# Patient Record
Sex: Male | Born: 1935 | Race: White | Hispanic: No | Marital: Single | Smoking: Current every day smoker
Health system: Southern US, Community
[De-identification: ages and names within clinical notes are randomized; demographics above are authoritative.]

## PROBLEM LIST (undated history)

## (undated) DIAGNOSIS — J449 Chronic obstructive pulmonary disease, unspecified: Secondary | ICD-10-CM

## (undated) DIAGNOSIS — N289 Disorder of kidney and ureter, unspecified: Secondary | ICD-10-CM

## (undated) DIAGNOSIS — L139 Bullous disorder, unspecified: Secondary | ICD-10-CM

## (undated) DIAGNOSIS — E119 Type 2 diabetes mellitus without complications: Secondary | ICD-10-CM

---

## 1999-01-08 ENCOUNTER — Encounter: Admission: RE | Admit: 1999-01-08 | Discharge: 1999-03-17 | Payer: Self-pay | Admitting: Internal Medicine

## 1999-04-07 ENCOUNTER — Encounter: Admission: RE | Admit: 1999-04-07 | Discharge: 1999-07-06 | Payer: Self-pay | Admitting: Internal Medicine

## 1999-08-04 ENCOUNTER — Encounter: Admission: RE | Admit: 1999-08-04 | Discharge: 1999-11-02 | Payer: Self-pay | Admitting: Orthopedic Surgery

## 2000-03-01 ENCOUNTER — Encounter: Admission: RE | Admit: 2000-03-01 | Discharge: 2000-05-30 | Payer: Self-pay | Admitting: Orthopedic Surgery

## 2000-06-08 ENCOUNTER — Encounter: Admission: RE | Admit: 2000-06-08 | Discharge: 2000-09-06 | Payer: Self-pay | Admitting: Orthopedic Surgery

## 2000-06-24 ENCOUNTER — Encounter: Payer: Self-pay | Admitting: Endocrinology

## 2000-06-24 ENCOUNTER — Ambulatory Visit (HOSPITAL_COMMUNITY): Admission: RE | Admit: 2000-06-24 | Discharge: 2000-06-24 | Payer: Self-pay | Admitting: Endocrinology

## 2000-09-07 ENCOUNTER — Encounter: Admission: RE | Admit: 2000-09-07 | Discharge: 2000-12-06 | Payer: Self-pay | Admitting: Internal Medicine

## 2000-12-15 ENCOUNTER — Encounter: Admission: RE | Admit: 2000-12-15 | Discharge: 2000-12-16 | Payer: Self-pay | Admitting: Internal Medicine

## 2001-04-20 ENCOUNTER — Encounter: Admission: RE | Admit: 2001-04-20 | Discharge: 2001-04-25 | Payer: Self-pay | Admitting: Internal Medicine

## 2002-02-27 ENCOUNTER — Encounter (HOSPITAL_BASED_OUTPATIENT_CLINIC_OR_DEPARTMENT_OTHER): Admission: RE | Admit: 2002-02-27 | Discharge: 2002-03-03 | Payer: Self-pay | Admitting: Orthopedic Surgery

## 2002-06-06 ENCOUNTER — Encounter (HOSPITAL_BASED_OUTPATIENT_CLINIC_OR_DEPARTMENT_OTHER): Admission: RE | Admit: 2002-06-06 | Discharge: 2002-08-30 | Payer: Self-pay | Admitting: Internal Medicine

## 2002-09-15 ENCOUNTER — Encounter (HOSPITAL_BASED_OUTPATIENT_CLINIC_OR_DEPARTMENT_OTHER): Admission: RE | Admit: 2002-09-15 | Discharge: 2002-12-14 | Payer: Self-pay | Admitting: Internal Medicine

## 2003-06-29 ENCOUNTER — Encounter (HOSPITAL_BASED_OUTPATIENT_CLINIC_OR_DEPARTMENT_OTHER): Admission: RE | Admit: 2003-06-29 | Discharge: 2003-09-27 | Payer: Self-pay | Admitting: Internal Medicine

## 2003-09-28 ENCOUNTER — Encounter (HOSPITAL_BASED_OUTPATIENT_CLINIC_OR_DEPARTMENT_OTHER): Admission: RE | Admit: 2003-09-28 | Discharge: 2003-12-14 | Payer: Self-pay | Admitting: Internal Medicine

## 2004-01-02 ENCOUNTER — Encounter (HOSPITAL_BASED_OUTPATIENT_CLINIC_OR_DEPARTMENT_OTHER): Admission: RE | Admit: 2004-01-02 | Discharge: 2004-03-28 | Payer: Self-pay | Admitting: Internal Medicine

## 2004-04-29 ENCOUNTER — Encounter (HOSPITAL_BASED_OUTPATIENT_CLINIC_OR_DEPARTMENT_OTHER): Admission: RE | Admit: 2004-04-29 | Discharge: 2004-07-21 | Payer: Self-pay | Admitting: Internal Medicine

## 2004-10-13 ENCOUNTER — Encounter (HOSPITAL_BASED_OUTPATIENT_CLINIC_OR_DEPARTMENT_OTHER): Admission: RE | Admit: 2004-10-13 | Discharge: 2004-11-14 | Payer: Self-pay | Admitting: Internal Medicine

## 2004-11-28 ENCOUNTER — Encounter (HOSPITAL_BASED_OUTPATIENT_CLINIC_OR_DEPARTMENT_OTHER): Admission: RE | Admit: 2004-11-28 | Discharge: 2005-02-26 | Payer: Self-pay | Admitting: Surgery

## 2005-02-02 ENCOUNTER — Encounter: Admission: RE | Admit: 2005-02-02 | Discharge: 2005-02-02 | Payer: Self-pay | Admitting: Internal Medicine

## 2005-03-04 ENCOUNTER — Encounter: Admission: RE | Admit: 2005-03-04 | Discharge: 2005-03-04 | Payer: Self-pay | Admitting: Internal Medicine

## 2005-05-04 ENCOUNTER — Ambulatory Visit (HOSPITAL_BASED_OUTPATIENT_CLINIC_OR_DEPARTMENT_OTHER): Admission: RE | Admit: 2005-05-04 | Discharge: 2005-05-04 | Payer: Self-pay | Admitting: Urology

## 2005-05-04 ENCOUNTER — Ambulatory Visit (HOSPITAL_COMMUNITY): Admission: RE | Admit: 2005-05-04 | Discharge: 2005-05-04 | Payer: Self-pay | Admitting: Urology

## 2009-01-15 ENCOUNTER — Encounter: Admission: RE | Admit: 2009-01-15 | Discharge: 2009-01-15 | Payer: Self-pay | Admitting: Internal Medicine

## 2010-04-24 ENCOUNTER — Encounter (HOSPITAL_COMMUNITY): Admission: RE | Admit: 2010-04-24 | Discharge: 2010-07-17 | Payer: Self-pay | Admitting: Nephrology

## 2010-04-25 ENCOUNTER — Inpatient Hospital Stay (HOSPITAL_COMMUNITY): Admission: EM | Admit: 2010-04-25 | Discharge: 2010-04-28 | Payer: Self-pay | Admitting: Emergency Medicine

## 2010-10-30 LAB — URINALYSIS, ROUTINE W REFLEX MICROSCOPIC
Glucose, UA: 100 mg/dL — AB
Ketones, ur: NEGATIVE mg/dL
Protein, ur: >300 mg/dL — AB
pH: 5 (ref 5.0–8.0)

## 2010-10-30 LAB — URINE MICROSCOPIC-ADD ON

## 2010-10-30 LAB — POCT I-STAT, CHEM 8
BUN: 44 mg/dL — ABNORMAL HIGH (ref 6–23)
Chloride: 110 mEq/L (ref 96–112)
Creatinine, Ser: 2.3 mg/dL — ABNORMAL HIGH (ref 0.4–1.5)
Glucose, Bld: 215 mg/dL — ABNORMAL HIGH (ref 70–99)
Potassium: 4.2 mEq/L (ref 3.5–5.1)
Sodium: 137 mEq/L (ref 135–145)

## 2010-10-30 LAB — BLOOD GAS, ARTERIAL
Acid-base deficit: 5.7 mmol/L — ABNORMAL HIGH (ref 0.0–2.0)
Drawn by: 252031
O2 Content: 2 L/min
O2 Saturation: 93.9 %
pCO2 arterial: 27.9 mmHg — ABNORMAL LOW (ref 35.0–45.0)

## 2010-10-30 LAB — CBC
HCT: 34.7 % — ABNORMAL LOW (ref 39.0–52.0)
Hemoglobin: 10.2 g/dL — ABNORMAL LOW (ref 13.0–17.0)
Hemoglobin: 10.5 g/dL — ABNORMAL LOW (ref 13.0–17.0)
Hemoglobin: 11.2 g/dL — ABNORMAL LOW (ref 13.0–17.0)
Hemoglobin: 11.6 g/dL — ABNORMAL LOW (ref 13.0–17.0)
MCH: 28.3 pg (ref 26.0–34.0)
MCH: 28.4 pg (ref 26.0–34.0)
MCH: 28.8 pg (ref 26.0–34.0)
MCHC: 32.6 g/dL (ref 30.0–36.0)
MCV: 87.8 fL (ref 78.0–100.0)
Platelets: 157 10*3/uL (ref 150–400)
Platelets: 174 10*3/uL (ref 150–400)
Platelets: 184 10*3/uL (ref 150–400)
RBC: 3.59 MIL/uL — ABNORMAL LOW (ref 4.22–5.81)
RBC: 4.03 MIL/uL — ABNORMAL LOW (ref 4.22–5.81)
RDW: 14.8 % (ref 11.5–15.5)
RDW: 14.9 % (ref 11.5–15.5)
WBC: 10.6 10*3/uL — ABNORMAL HIGH (ref 4.0–10.5)
WBC: 13.9 10*3/uL — ABNORMAL HIGH (ref 4.0–10.5)
WBC: 15 10*3/uL — ABNORMAL HIGH (ref 4.0–10.5)

## 2010-10-30 LAB — LACTIC ACID, PLASMA: Lactic Acid, Venous: 1.8 mmol/L (ref 0.5–2.2)

## 2010-10-30 LAB — URINE CULTURE
Colony Count: >=100000
Culture  Setup Time: 201109091822

## 2010-10-30 LAB — COMPREHENSIVE METABOLIC PANEL
ALT: 20 U/L (ref 0–53)
AST: 21 U/L (ref 0–37)
Albumin: 2.7 g/dL — ABNORMAL LOW (ref 3.5–5.2)
Chloride: 108 mEq/L (ref 96–112)
Creatinine, Ser: 2.2 mg/dL — ABNORMAL HIGH (ref 0.4–1.5)
GFR calc Af Amer: 35 mL/min — ABNORMAL LOW (ref >60)
Potassium: 3.9 mEq/L (ref 3.5–5.1)
Sodium: 136 mEq/L (ref 135–145)
Total Bilirubin: 0.9 mg/dL (ref 0.3–1.2)

## 2010-10-30 LAB — GLUCOSE, CAPILLARY
Glucose-Capillary: 135 mg/dL — ABNORMAL HIGH (ref 70–99)
Glucose-Capillary: 146 mg/dL — ABNORMAL HIGH (ref 70–99)
Glucose-Capillary: 175 mg/dL — ABNORMAL HIGH (ref 70–99)
Glucose-Capillary: 311 mg/dL — ABNORMAL HIGH (ref 70–99)
Glucose-Capillary: 330 mg/dL — ABNORMAL HIGH (ref 70–99)
Glucose-Capillary: 347 mg/dL — ABNORMAL HIGH (ref 70–99)
Glucose-Capillary: 400 mg/dL — ABNORMAL HIGH (ref 70–99)

## 2010-10-30 LAB — MAGNESIUM: Magnesium: 1.9 mg/dL (ref 1.5–2.5)

## 2010-10-30 LAB — BASIC METABOLIC PANEL
BUN: 34 mg/dL — ABNORMAL HIGH (ref 6–23)
CO2: 20 mEq/L (ref 19–32)
CO2: 21 mEq/L (ref 19–32)
Calcium: 8.8 mg/dL (ref 8.4–10.5)
Chloride: 109 mEq/L (ref 96–112)
Creatinine, Ser: 2.12 mg/dL — ABNORMAL HIGH (ref 0.4–1.5)
Creatinine, Ser: 2.41 mg/dL — ABNORMAL HIGH (ref 0.4–1.5)
GFR calc non Af Amer: 30 mL/min — ABNORMAL LOW (ref >60)
Glucose, Bld: 336 mg/dL — ABNORMAL HIGH (ref 70–99)
Sodium: 139 mEq/L (ref 135–145)

## 2010-10-30 LAB — CULTURE, BLOOD (ROUTINE X 2)
Culture: NO GROWTH
Culture: NO GROWTH

## 2010-10-30 LAB — DIFFERENTIAL
Lymphocytes Relative: 8 % — ABNORMAL LOW (ref 12–46)
Lymphs Abs: 1.2 10*3/uL (ref 0.7–4.0)
Monocytes Relative: 9 % (ref 3–12)
Neutrophils Relative %: 83 % — ABNORMAL HIGH (ref 43–77)

## 2010-10-30 LAB — BRAIN NATRIURETIC PEPTIDE: Pro B Natriuretic peptide (BNP): 212 pg/mL — ABNORMAL HIGH (ref 0.0–100.0)

## 2011-01-02 NOTE — Op Note (Signed)
NAME:  Dalton Lane, Dalton Lane NO.:  [id number]   MEDICAL RECORD NO.:  [id number]          PATIENT TYPE:  AMB   LOCATION:  [facility]                         FACILITY:  [facility]   PHYSICIAN:  Maretta Bees. Vonita Moss, M.D.DATE OF BIRTH:  09/11/1929   DATE OF PROCEDURE:  05/04/2005  DATE OF DISCHARGE:                                 OPERATIVE REPORT   PREOPERATIVE DIAGNOSES:  Phimosis and balanitis.   POSTOPERATIVE DIAGNOSES:  Phimosis and balanitis.   PROCEDURE:  Circumcision.   SURGEON:  Dr. Larey Dresser.   ANESTHESIA:  General.   INDICATIONS:  This 77 year old gentleman has had recurrent balanitis and  penile irritation and has had nonretractable foreskin with very thickened  skin. He was counseled about circumcision and brought to the OR for that  reason and wished to be put to sleep with general anesthesia.   DESCRIPTION OF PROCEDURE:  The patient was brought to the operating room,  placed in supine position. The external genitalia were prepped and draped in  the usual fashion. A circumcision was performed using the sleeve technique.  Hemostasis was obtained by catgut ties and electrocautery. The frenulum area  was closed with running 4-0 chromic catgut. The distal edge of penile skin  was reapproximated to the mucosal edge of foreskin a 3, 6, 9 and 12 o'clock  with 4-0 chromic catgut. Running 4-0 chromic catgut was placed between these  quadrant sutures. The wound was cleaned and dressed with Vaseline gauze, dry  sterile gauze and Coban. He was taken to the recovery room in good condition  having tolerated the procedure well.      Maretta Bees. Vonita Moss, M.D.  Electronically Signed     LJP/MEDQ  D:  05/04/2005  T:  05/04/2005  Job:  [id number]

## 2013-05-26 ENCOUNTER — Other Ambulatory Visit (HOSPITAL_COMMUNITY): Payer: Self-pay | Admitting: *Deleted

## 2013-05-29 ENCOUNTER — Ambulatory Visit (HOSPITAL_COMMUNITY)
Admission: RE | Admit: 2013-05-29 | Discharge: 2013-05-29 | Disposition: A | Discharge status: Home / Self Care | Payer: Medicare Other | Source: Ambulatory Visit | Attending: Nephrology | Admitting: Nephrology

## 2013-05-29 DIAGNOSIS — D509 Iron deficiency anemia, unspecified: Secondary | ICD-10-CM | POA: Insufficient documentation

## 2013-05-29 MED ORDER — SODIUM CHLORIDE 0.9 % IV SOLN: INTRAVENOUS | Status: DC

## 2013-05-29 MED ORDER — SODIUM CHLORIDE 0.9 % IV SOLN
1020.0000 mg | Freq: Once | INTRAVENOUS | Status: AC
  Administered 2013-05-29: 1020 mg via INTRAVENOUS
  Filled 2013-05-29: qty 34

## 2014-04-05 ENCOUNTER — Other Ambulatory Visit (HOSPITAL_COMMUNITY): Payer: Self-pay

## 2014-04-06 ENCOUNTER — Ambulatory Visit (HOSPITAL_COMMUNITY)
Admission: RE | Admit: 2014-04-06 | Discharge: 2014-04-06 | Disposition: A | Discharge status: Home / Self Care | Payer: Medicare Other | Source: Ambulatory Visit | Attending: Nephrology | Admitting: Nephrology

## 2014-04-06 ENCOUNTER — Encounter (HOSPITAL_COMMUNITY): Payer: Medicare Other

## 2014-04-06 DIAGNOSIS — D509 Iron deficiency anemia, unspecified: Secondary | ICD-10-CM | POA: Diagnosis present

## 2014-04-06 MED ORDER — FERUMOXYTOL INJECTION 510 MG/17 ML
1020.0000 mg | Freq: Once | INTRAVENOUS | Status: AC
  Administered 2014-04-06: 1020 mg via INTRAVENOUS
  Filled 2014-04-06: qty 34

## 2014-07-24 ENCOUNTER — Inpatient Hospital Stay (HOSPITAL_COMMUNITY)
Admission: EM | Admit: 2014-07-24 | Discharge: 2014-07-30 | DRG: 872 | Disposition: A | Discharge status: Skilled Nursing Facility | Payer: Medicare Other | Attending: Internal Medicine | Admitting: Internal Medicine

## 2014-07-24 ENCOUNTER — Encounter (HOSPITAL_COMMUNITY): Payer: Self-pay | Admitting: Physical Medicine and Rehabilitation

## 2014-07-24 ENCOUNTER — Inpatient Hospital Stay (HOSPITAL_COMMUNITY): Payer: Medicare Other

## 2014-07-24 DIAGNOSIS — L120 Bullous pemphigoid: Secondary | ICD-10-CM | POA: Diagnosis present

## 2014-07-24 DIAGNOSIS — Z7982 Long term (current) use of aspirin: Secondary | ICD-10-CM | POA: Diagnosis not present

## 2014-07-24 DIAGNOSIS — R05 Cough: Secondary | ICD-10-CM

## 2014-07-24 DIAGNOSIS — Z794 Long term (current) use of insulin: Secondary | ICD-10-CM | POA: Diagnosis not present

## 2014-07-24 DIAGNOSIS — IMO0002 Reserved for concepts with insufficient information to code with codable children: Secondary | ICD-10-CM | POA: Diagnosis present

## 2014-07-24 DIAGNOSIS — R6520 Severe sepsis without septic shock: Secondary | ICD-10-CM

## 2014-07-24 DIAGNOSIS — N184 Chronic kidney disease, stage 4 (severe): Secondary | ICD-10-CM | POA: Diagnosis present

## 2014-07-24 DIAGNOSIS — E1165 Type 2 diabetes mellitus with hyperglycemia: Secondary | ICD-10-CM | POA: Diagnosis present

## 2014-07-24 DIAGNOSIS — Z7952 Long term (current) use of systemic steroids: Secondary | ICD-10-CM | POA: Diagnosis not present

## 2014-07-24 DIAGNOSIS — N189 Chronic kidney disease, unspecified: Secondary | ICD-10-CM

## 2014-07-24 DIAGNOSIS — N179 Acute kidney failure, unspecified: Secondary | ICD-10-CM | POA: Diagnosis present

## 2014-07-24 DIAGNOSIS — E1129 Type 2 diabetes mellitus with other diabetic kidney complication: Secondary | ICD-10-CM

## 2014-07-24 DIAGNOSIS — R059 Cough, unspecified: Secondary | ICD-10-CM

## 2014-07-24 DIAGNOSIS — A419 Sepsis, unspecified organism: Secondary | ICD-10-CM | POA: Diagnosis present

## 2014-07-24 DIAGNOSIS — E1122 Type 2 diabetes mellitus with diabetic chronic kidney disease: Secondary | ICD-10-CM

## 2014-07-24 DIAGNOSIS — F17210 Nicotine dependence, cigarettes, uncomplicated: Secondary | ICD-10-CM | POA: Diagnosis present

## 2014-07-24 DIAGNOSIS — L139 Bullous disorder, unspecified: Secondary | ICD-10-CM

## 2014-07-24 DIAGNOSIS — J449 Chronic obstructive pulmonary disease, unspecified: Secondary | ICD-10-CM | POA: Diagnosis present

## 2014-07-24 DIAGNOSIS — R112 Nausea with vomiting, unspecified: Secondary | ICD-10-CM

## 2014-07-24 DIAGNOSIS — L03116 Cellulitis of left lower limb: Secondary | ICD-10-CM | POA: Diagnosis present

## 2014-07-24 DIAGNOSIS — E875 Hyperkalemia: Secondary | ICD-10-CM | POA: Diagnosis present

## 2014-07-24 DIAGNOSIS — L03115 Cellulitis of right lower limb: Secondary | ICD-10-CM | POA: Diagnosis present

## 2014-07-24 DIAGNOSIS — Z881 Allergy status to other antibiotic agents status: Secondary | ICD-10-CM | POA: Diagnosis not present

## 2014-07-24 DIAGNOSIS — R52 Pain, unspecified: Secondary | ICD-10-CM

## 2014-07-24 DIAGNOSIS — I959 Hypotension, unspecified: Secondary | ICD-10-CM

## 2014-07-24 HISTORY — DX: Bullous disorder, unspecified: L13.9

## 2014-07-24 HISTORY — DX: Disorder of kidney and ureter, unspecified: N28.9

## 2014-07-24 HISTORY — DX: Chronic obstructive pulmonary disease, unspecified: J44.9

## 2014-07-24 HISTORY — DX: Type 2 diabetes mellitus without complications: E11.9

## 2014-07-24 LAB — URINALYSIS, ROUTINE W REFLEX MICROSCOPIC
Bilirubin Urine: NEGATIVE
Glucose, UA: 100 mg/dL — AB
Ketones, ur: NEGATIVE mg/dL
NITRITE: NEGATIVE
Protein, ur: NEGATIVE mg/dL
SPECIFIC GRAVITY, URINE: 1.011 (ref 1.005–1.030)
Urobilinogen, UA: 0.2 mg/dL (ref 0.0–1.0)
pH: 7 (ref 5.0–8.0)

## 2014-07-24 LAB — CBC WITH DIFFERENTIAL/PLATELET
Basophils Absolute: 0 10*3/uL (ref 0.0–0.1)
Basophils Relative: 0 % (ref 0–1)
Eosinophils Absolute: 0 10*3/uL (ref 0.0–0.7)
Eosinophils Relative: 0 % (ref 0–5)
HCT: 35.2 % — ABNORMAL LOW (ref 39.0–52.0)
Hemoglobin: 11.4 g/dL — ABNORMAL LOW (ref 13.0–17.0)
LYMPHS ABS: 1.5 10*3/uL (ref 0.7–4.0)
LYMPHS PCT: 14 % (ref 12–46)
MCH: 30.8 pg (ref 26.0–34.0)
MCHC: 32.4 g/dL (ref 30.0–36.0)
MCV: 95.1 fL (ref 78.0–100.0)
Monocytes Absolute: 0.4 10*3/uL (ref 0.1–1.0)
Monocytes Relative: 4 % (ref 3–12)
NEUTROS PCT: 82 % — AB (ref 43–77)
Neutro Abs: 8.5 10*3/uL — ABNORMAL HIGH (ref 1.7–7.7)
PLATELETS: 139 10*3/uL — AB (ref 150–400)
RBC: 3.7 MIL/uL — AB (ref 4.22–5.81)
RDW: 17.2 % — ABNORMAL HIGH (ref 11.5–15.5)
WBC: 10.4 10*3/uL (ref 4.0–10.5)

## 2014-07-24 LAB — COMPREHENSIVE METABOLIC PANEL
ALK PHOS: 70 U/L (ref 39–117)
ALT: 21 U/L (ref 0–53)
AST: 11 U/L (ref 0–37)
Albumin: 3.1 g/dL — ABNORMAL LOW (ref 3.5–5.2)
Anion gap: 21 — ABNORMAL HIGH (ref 5–15)
BUN: 109 mg/dL — ABNORMAL HIGH (ref 6–23)
CO2: 19 meq/L (ref 19–32)
Calcium: 9.2 mg/dL (ref 8.4–10.5)
Chloride: 95 mEq/L — ABNORMAL LOW (ref 96–112)
Creatinine, Ser: 3.19 mg/dL — ABNORMAL HIGH (ref 0.50–1.35)
GFR calc non Af Amer: 17 mL/min — ABNORMAL LOW (ref >90)
GFR, EST AFRICAN AMERICAN: 19 mL/min — AB (ref >90)
GLUCOSE: 295 mg/dL — AB (ref 70–99)
POTASSIUM: 5.5 meq/L — AB (ref 3.7–5.3)
SODIUM: 135 meq/L — AB (ref 137–147)
TOTAL PROTEIN: 6.5 g/dL (ref 6.0–8.3)
Total Bilirubin: 0.4 mg/dL (ref 0.3–1.2)

## 2014-07-24 LAB — URINE MICROSCOPIC-ADD ON

## 2014-07-24 LAB — GLUCOSE, CAPILLARY: GLUCOSE-CAPILLARY: 198 mg/dL — AB (ref 70–99)

## 2014-07-24 LAB — I-STAT CG4 LACTIC ACID, ED: Lactic Acid, Venous: 3.63 mmol/L — ABNORMAL HIGH (ref 0.5–2.2)

## 2014-07-24 LAB — SEDIMENTATION RATE: Sed Rate: 59 mm/hr — ABNORMAL HIGH (ref 0–16)

## 2014-07-24 MED ORDER — SODIUM CHLORIDE 0.9 % IJ SOLN
3.0000 mL | Freq: Two times a day (BID) | INTRAMUSCULAR | Status: DC
  Administered 2014-07-25 – 2014-07-30 (×9): 3 mL via INTRAVENOUS

## 2014-07-24 MED ORDER — ACETAMINOPHEN 325 MG PO TABS: 650.0000 mg | ORAL_TABLET | Freq: Four times a day (QID) | ORAL | Status: DC | PRN

## 2014-07-24 MED ORDER — ASPIRIN EC 325 MG PO TBEC
325.0000 mg | DELAYED_RELEASE_TABLET | Freq: Every day | ORAL | Status: DC
  Administered 2014-07-25 – 2014-07-30 (×6): 325 mg via ORAL
  Filled 2014-07-24 (×7): qty 1

## 2014-07-24 MED ORDER — SODIUM BICARBONATE 650 MG PO TABS
325.0000 mg | ORAL_TABLET | Freq: Every day | ORAL | Status: DC
  Administered 2014-07-25 – 2014-07-30 (×6): 325 mg via ORAL
  Filled 2014-07-24 (×6): qty 0.5

## 2014-07-24 MED ORDER — PREDNISONE 5 MG PO TABS
5.0000 mg | ORAL_TABLET | Freq: Every day | ORAL | Status: DC
  Administered 2014-07-25: 5 mg via ORAL
  Filled 2014-07-24: qty 1

## 2014-07-24 MED ORDER — VANCOMYCIN HCL 10 G IV SOLR
2000.0000 mg | Freq: Once | INTRAVENOUS | Status: AC
  Administered 2014-07-24: 2000 mg via INTRAVENOUS
  Filled 2014-07-24: qty 2000

## 2014-07-24 MED ORDER — HYDROCODONE-ACETAMINOPHEN 7.5-325 MG PO TABS
1.0000 | ORAL_TABLET | Freq: Four times a day (QID) | ORAL | Status: DC | PRN
  Administered 2014-07-25: 1 via ORAL
  Filled 2014-07-24: qty 1

## 2014-07-24 MED ORDER — HEPARIN SODIUM (PORCINE) 5000 UNIT/ML IJ SOLN
5000.0000 [IU] | Freq: Three times a day (TID) | INTRAMUSCULAR | Status: DC
  Administered 2014-07-25 – 2014-07-30 (×17): 5000 [IU] via SUBCUTANEOUS
  Filled 2014-07-24 (×22): qty 1

## 2014-07-24 MED ORDER — ROFLUMILAST 500 MCG PO TABS
500.0000 ug | ORAL_TABLET | Freq: Every day | ORAL | Status: DC
  Administered 2014-07-25 – 2014-07-30 (×6): 500 ug via ORAL
  Filled 2014-07-24 (×6): qty 1

## 2014-07-24 MED ORDER — INSULIN ASPART 100 UNIT/ML ~~LOC~~ SOLN
0.0000 [IU] | Freq: Three times a day (TID) | SUBCUTANEOUS | Status: DC
  Administered 2014-07-25 – 2014-07-26 (×3): 3 [IU] via SUBCUTANEOUS
  Administered 2014-07-26: 11 [IU] via SUBCUTANEOUS
  Administered 2014-07-27: 2 [IU] via SUBCUTANEOUS
  Administered 2014-07-27: 3 [IU] via SUBCUTANEOUS
  Administered 2014-07-27: 5 [IU] via SUBCUTANEOUS
  Administered 2014-07-28: 2 [IU] via SUBCUTANEOUS
  Administered 2014-07-28: 5 [IU] via SUBCUTANEOUS
  Administered 2014-07-28: 2 [IU] via SUBCUTANEOUS
  Administered 2014-07-29: 3 [IU] via SUBCUTANEOUS

## 2014-07-24 MED ORDER — SODIUM CHLORIDE 0.9 % IV BOLUS (SEPSIS)
1000.0000 mL | Freq: Once | INTRAVENOUS | Status: AC
  Administered 2014-07-24: 1000 mL via INTRAVENOUS

## 2014-07-24 MED ORDER — PRAVASTATIN SODIUM 80 MG PO TABS
80.0000 mg | ORAL_TABLET | Freq: Every day | ORAL | Status: DC
  Administered 2014-07-25 – 2014-07-30 (×6): 80 mg via ORAL
  Filled 2014-07-24 (×7): qty 1

## 2014-07-24 MED ORDER — CEFTRIAXONE SODIUM 1 G IJ SOLR
1.0000 g | Freq: Once | INTRAMUSCULAR | Status: AC
  Administered 2014-07-24: 1 g via INTRAVENOUS
  Filled 2014-07-24: qty 10

## 2014-07-24 MED ORDER — SODIUM CHLORIDE 0.9 % IV BOLUS (SEPSIS): 500.0000 mL | Freq: Once | INTRAVENOUS | Status: DC

## 2014-07-24 MED ORDER — SODIUM CHLORIDE 0.9 % IV SOLN: INTRAVENOUS | Status: DC

## 2014-07-24 MED ORDER — METOPROLOL SUCCINATE ER 50 MG PO TB24
50.0000 mg | ORAL_TABLET | Freq: Every day | ORAL | Status: DC
  Administered 2014-07-25: 50 mg via ORAL
  Filled 2014-07-24 (×2): qty 1

## 2014-07-24 MED ORDER — INSULIN GLARGINE 100 UNIT/ML ~~LOC~~ SOLN
24.0000 [IU] | Freq: Every day | SUBCUTANEOUS | Status: DC
  Administered 2014-07-24 – 2014-07-29 (×6): 24 [IU] via SUBCUTANEOUS
  Filled 2014-07-24 (×8): qty 0.24

## 2014-07-24 MED ORDER — SODIUM CHLORIDE 0.9 % IV SOLN
INTRAVENOUS | Status: DC
  Administered 2014-07-25 – 2014-07-26 (×2): via INTRAVENOUS

## 2014-07-24 MED ORDER — DEXTROSE 5 % IV SOLN
1.0000 g | INTRAVENOUS | Status: DC
  Administered 2014-07-25 – 2014-07-29 (×5): 1 g via INTRAVENOUS
  Filled 2014-07-24 (×6): qty 1

## 2014-07-24 MED ORDER — DEXTROSE 5 % IV SOLN
2.0000 g | Freq: Once | INTRAVENOUS | Status: AC
  Administered 2014-07-25: 2 g via INTRAVENOUS
  Filled 2014-07-24: qty 2

## 2014-07-24 NOTE — H&P (Addendum)
[facility] History and Physical  Dalton Lane ZOX:[id number] DOB: 06-02-1930 DOA: 07/24/2014  Referring physician: EDP PCP: Alva GarnetSHELTON,KIMBERLY R., MD   Chief Complaint: Sepsis   HPI: Dalton Lane is a 80 y.o. male with history of bullous skin disease outbreaks (unclear if bullous pemphigoid or pemphigus vulgaris).  [facility] with outbreak on BLE lower legs and feet which have become erythematous, edematous.  Symptoms worsening and onset over the past week, LLE has gotten really bad his wife says especially over the past day.  He has a h/o DM2 and does have chronic numbness of BLE but states he has feeling in B feet and isnt really in pain down there.  Review of Systems: Systems reviewed.  As above, otherwise negative  Past Medical History  Diagnosis Date  . COPD (chronic obstructive pulmonary disease)   . Kidney disease   . Diabetes mellitus without complication   . Skin disease, bullous    History reviewed. No pertinent past surgical history. Social History:  reports that he has never smoked. He does not have any smokeless tobacco history on file. He reports that he does not drink alcohol or use illicit drugs.  Allergies  Allergen Reactions  . Amoxicillin Itching    No family history on file.   Prior to Admission medications   Medication Sig Start Date End Date Taking? Authorizing Provider  acetaminophen (TYLENOL) 325 MG tablet Take 650 mg by mouth every 6 (six) hours as needed for mild pain.   Yes Historical Provider, MD  aspirin EC 325 MG tablet Take 325 mg by mouth daily.   Yes Historical Provider, MD  Cholecalciferol (VITAMIN D3) 2000 UNITS TABS Take 2,000 Units by mouth daily.   Yes Historical Provider, MD  furosemide (LASIX) 40 MG tablet Take 40 mg by mouth daily.   Yes Historical Provider, MD  HYDROcodone-acetaminophen (NORCO) 7.5-325 MG per tablet Take 1 tablet by mouth every 6 (six) hours as needed for moderate pain.   Yes  Historical Provider, MD  insulin aspart (NOVOLOG) 100 UNIT/ML injection Inject 6-7 Units into the skin daily as needed (with supper according to sliding scale).   Yes Historical Provider, MD  insulin glargine (LANTUS) 100 UNIT/ML injection Inject 24 Units into the skin at bedtime.   Yes Historical Provider, MD  linagliptin (TRADJENTA) 5 MG TABS tablet Take 5 mg by mouth daily.   Yes Historical Provider, MD  metoprolol succinate (TOPROL-XL) 50 MG 24 hr tablet Take 50 mg by mouth daily. Take with or immediately following a meal.   Yes Historical Provider, MD  pravastatin (PRAVACHOL) 80 MG tablet Take 80 mg by mouth daily.   Yes Historical Provider, MD  predniSONE (DELTASONE) 5 MG tablet Take 5 mg by mouth daily.   Yes Historical Provider, MD  roflumilast (DALIRESP) 500 MCG TABS tablet Take 500 mcg by mouth daily.   Yes Historical Provider, MD  sodium bicarbonate 325 MG tablet Take 325 mg by mouth daily.   Yes Historical Provider, MD  triamcinolone cream (KENALOG) 0.1 % Apply 1 application topically 2 (two) times daily as needed (for itching).   Yes Historical Provider, MD   Physical Exam: Filed Vitals:   07/24/14 1929  BP: 96/58  Pulse: 80  Temp: 97.7 F (36.5 C)  Resp: 29    BP 96/58 mmHg  Pulse 80  Temp(Src) 97.7 F (36.5 C) (Oral)  Resp 29  SpO2 95%  General Appearance:    Alert, oriented, no distress,  appears stated age  Head:    Normocephalic, atraumatic  Eyes:    PERRL, EOMI, sclera non-icteric        Nose:   Nares without drainage or epistaxis. Mucosa, turbinates normal  Throat:   Moist mucous membranes. Oropharynx without erythema or exudate.  Neck:   Supple. No carotid bruits.  No thyromegaly.  No lymphadenopathy.   Back:     No CVA tenderness, no spinal tenderness  Lungs:     Clear to auscultation bilaterally, without wheezes, rhonchi or rales  Chest wall:    No tenderness to palpitation  Heart:    Regular rate and rhythm without murmurs, gallops, rubs  Abdomen:      Soft, non-tender, nondistended, normal bowel sounds, no organomegaly  Genitalia:    deferred  Rectal:    deferred  Extremities:   No clubbing, cyanosis or edema.  Pulses:   2+ and symmetric all extremities  Skin:   BLE edema, erythema, skin breakdown, blood blister on bottom of L foot, 4cm blister to dorsum of lateral distal aspect of left foot near small toe with purple discoloration.  No pain beyond site of erythema, no crepitus.  Lymph nodes:   Cervical, supraclavicular, and axillary nodes normal  Neurologic:   CNII-XII intact. Normal strength, sensation and reflexes      throughout    Labs on Admission:  Basic Metabolic Panel:  Recent Labs Lab 07/24/14 1826  NA 135*  K 5.5*  CL 95*  CO2 19  GLUCOSE 295*  BUN 109*  CREATININE 3.19*  CALCIUM 9.2   Liver Function Tests:  Recent Labs Lab 07/24/14 1826  AST 11  ALT 21  ALKPHOS 70  BILITOT 0.4  PROT 6.5  ALBUMIN 3.1*   No results for input(s): LIPASE, AMYLASE in the last 168 hours. No results for input(s): AMMONIA in the last 168 hours. CBC:  Recent Labs Lab 07/24/14 1826  WBC 10.4  NEUTROABS 8.5*  HGB 11.4*  HCT 35.2*  MCV 95.1  PLT 139*   Cardiac Enzymes: No results for input(s): CKTOTAL, CKMB, CKMBINDEX, TROPONINI in the last 168 hours.  BNP (last 3 results) No results for input(s): PROBNP in the last 8760 hours. CBG: No results for input(s): GLUCAP in the last 168 hours.  Radiological Exams on Admission: No results found.  EKG: Independently reviewed.  Assessment/Plan Principal Problem:   Severe sepsis with acute organ dysfunction Active Problems:   Acute on chronic kidney failure   Cellulitis of left foot   Cellulitis of right foot   Nausea and vomiting   DM2 (diabetes mellitus, type 2)   Hyperkalemia   1. Severe sepsis with kidney failure - due to cellulitis of BLE 1. Cefepime and vanc per pharm 2. IVF resuscitation 3. Has systemic symptoms and a discolored blood blister on bottom of  LLE that needs watching; however, patient is not raising any other red flags for necrotizing faciitis at this time (no pain beyond site of infection, no subcutaneous emphyzema, etc).  No indication for emergent surgical consultation at this time, likely will need routine surgical consultation re-debridement in AM. 4. Recheck lactate 2. AKI on CKD - see above 1. Strict intake and output 2. Hold lasix 3. IVF resuscitation 4. Place foley 5. Repeat BMP in AM, keep an eye on the mild hyperkalemia he has as well. 6. Continue home PO bicarb 3. DM2 1. Continue home lantus 2. Add med dose SSI AC/HS 4. Bullous skin disease 1. Continue prednisone 5. Nausea and  vomiting - symptom controll   Code Status: Full Code  Family Communication: Wife at bedside Disposition Plan: Admit to inpatient   Time spent: 70 min  Shaunak Kreis M. [facility] Pager [phone]  If 7AM-7PM, please contact the day team taking care of the patient [email or web address] Password [facility] 07/24/2014, 9:32 PM

## 2014-07-24 NOTE — ED Notes (Signed)
Admitting Dr at Bedside at bedside.

## 2014-07-24 NOTE — Progress Notes (Signed)
ANTIBIOTIC CONSULT NOTE - INITIAL  Pharmacy Consult for vancomycin, cefepime Indication: cellulitis  Allergies  Allergen Reactions  . Amoxicillin Itching    Patient Measurements:   Adjusted Body Weight:   Vital Signs: Temp: 97.7 F (36.5 C) (12/08 1929) Temp Source: Oral (12/08 1929) BP: 100/58 mmHg (12/08 2100) Pulse Rate: 70 (12/08 2100) Intake/Output from previous day:   Intake/Output from this shift:    Labs:  Recent Labs  07/24/14 1826  WBC 10.4  HGB 11.4*  PLT 139*  CREATININE 3.19*   CrCl cannot be calculated (Unknown ideal weight.). No results for input(s): VANCOTROUGH, VANCOPEAK, VANCORANDOM, GENTTROUGH, GENTPEAK, GENTRANDOM, TOBRATROUGH, TOBRAPEAK, TOBRARND, AMIKACINPEAK, AMIKACINTROU, AMIKACIN in the last 72 hours.   Microbiology: No results found for this or any previous visit (from the past 720 hour(s)).  Medical History: Past Medical History  Diagnosis Date  . COPD (chronic obstructive pulmonary disease)   . Kidney disease   . Diabetes mellitus without complication   . Skin disease, bullous     Medications:  See EMR  Assessment: Cellulitis of BLE, has acute on chronic kidney injury, SCr up to 3.19, wbc wnl, LA 3.6, rec'd ceftriaxone x1 in ED - now with plan to broaden coverage. Was taking doxycycline at home with no improvement.  Goal of Therapy:  Vancomycin trough level 10-15 mcg/ml  Plan:  Vancomycin 2 g IV x1 Cefepime 2 g IV x1, then 1 g IV q24h Watch renal function, cultures, duration of therapy    Agapito GamesAlison Levette Paulick, PharmD, BCPS Clinical Pharmacist Pager: [phone] 07/24/2014 9:47 PM

## 2014-07-24 NOTE — ED Notes (Signed)
Report attempt x 1 

## 2014-07-24 NOTE — ED Notes (Signed)
I stat lactic acid results:  3.63 mmol/L

## 2014-07-24 NOTE — ED Notes (Signed)
Patient transported to CT 

## 2014-07-24 NOTE — ED Provider Notes (Signed)
CSN: [id number]     Arrival date & time 07/24/14  1810 History   First MD Initiated Contact with Patient 07/24/14 1925     Chief Complaint  Patient presents with  . Foot Pain  . Leg Swelling     (Consider location/radiation/quality/duration/timing/severity/associated sxs/prior Treatment) HPI Comments: 80 year old male with history of COPD, diabetes, bullous pemphigoid presents with lower extremity edema, vomiting, worsening blistering and rash to the lower extremities for the past week. Patient is been on doxycycline however symptoms worsening and now not tolerating oral. Patient sent over to be admitted.  Patient has diabetes history and has chronic numbness in the lower extremities.  Patient is a 80 y.o. male presenting with lower extremity pain. The history is provided by the patient.  Foot Pain Pertinent negatives include no chest pain, no abdominal pain, no headaches and no shortness of breath.    Past Medical History  Diagnosis Date  . COPD (chronic obstructive pulmonary disease)   . Kidney disease   . Diabetes mellitus without complication   . Skin disease, bullous    History reviewed. No pertinent past surgical history. History reviewed. No pertinent family history. History  Substance Use Topics  . Smoking status: Current Every Day Smoker -- 0.50 packs/day for 59 years    Types: Cigarettes  . Smokeless tobacco: Not on file  . Alcohol Use: No    Review of Systems  Constitutional: Positive for fever and fatigue. Negative for chills.  HENT: Negative for congestion.   Eyes: Negative for visual disturbance.  Respiratory: Positive for cough. Negative for shortness of breath.   Cardiovascular: Positive for leg swelling. Negative for chest pain.  Gastrointestinal: Positive for nausea and vomiting. Negative for abdominal pain.  Genitourinary: Negative for dysuria and flank pain.  Musculoskeletal: Negative for back pain, neck pain and neck stiffness.  Skin: Positive for  rash.  Neurological: Negative for light-headedness and headaches.      Allergies  Amoxicillin  Home Medications   Prior to Admission medications   Medication Sig Start Date End Date Taking? Authorizing Provider  acetaminophen (TYLENOL) 325 MG tablet Take 650 mg by mouth every 6 (six) hours as needed for mild pain.   Yes Historical Provider, MD  aspirin EC 325 MG tablet Take 325 mg by mouth daily.   Yes Historical Provider, MD  Cholecalciferol (VITAMIN D3) 2000 UNITS TABS Take 2,000 Units by mouth daily.   Yes Historical Provider, MD  furosemide (LASIX) 40 MG tablet Take 40 mg by mouth daily.   Yes Historical Provider, MD  HYDROcodone-acetaminophen (NORCO) 7.5-325 MG per tablet Take 1 tablet by mouth every 6 (six) hours as needed for moderate pain.   Yes Historical Provider, MD  insulin aspart (NOVOLOG) 100 UNIT/ML injection Inject 6-7 Units into the skin daily as needed (with supper according to sliding scale).   Yes Historical Provider, MD  insulin glargine (LANTUS) 100 UNIT/ML injection Inject 24 Units into the skin at bedtime.   Yes Historical Provider, MD  linagliptin (TRADJENTA) 5 MG TABS tablet Take 5 mg by mouth daily.   Yes Historical Provider, MD  metoprolol succinate (TOPROL-XL) 50 MG 24 hr tablet Take 50 mg by mouth daily. Take with or immediately following a meal.   Yes Historical Provider, MD  pravastatin (PRAVACHOL) 80 MG tablet Take 80 mg by mouth daily.   Yes Historical Provider, MD  predniSONE (DELTASONE) 5 MG tablet Take 5 mg by mouth daily.   Yes Historical Provider, MD  roflumilast (DALIRESP) 500 MCG  TABS tablet Take 500 mcg by mouth daily.   Yes Historical Provider, MD  sodium bicarbonate 325 MG tablet Take 325 mg by mouth daily.   Yes Historical Provider, MD  triamcinolone cream (KENALOG) 0.1 % Apply 1 application topically 2 (two) times daily as needed (for itching).   Yes Historical Provider, MD   BP 113/62 mmHg  Pulse 84  Temp(Src) 98.1 F (36.7 C) (Oral)   Resp 21  Ht 6\' 2"  (1.88 m)  Wt 229 lb 11.5 oz (104.2 kg)  BMI 29.48 kg/m2  SpO2 100% Physical Exam  Constitutional: He is oriented to person, place, and time. He appears well-developed and well-nourished.  HENT:  Head: Normocephalic and atraumatic.  Eyes: Conjunctivae are normal. Right eye exhibits no discharge. Left eye exhibits no discharge.  Neck: Normal range of motion. Neck supple. No tracheal deviation present.  Cardiovascular: Normal rate and regular rhythm.   Pulmonary/Chest: Effort normal and breath sounds normal. Respiratory distress: decreased effort.  Abdominal: Soft. He exhibits no distension. There is no tenderness. There is no guarding.  Musculoskeletal: He exhibits edema ( Bilateral lower extremities 2+).  Neurological: He is alert and oriented to person, place, and time. GCS eye subscore is 4. GCS verbal subscore is 5. GCS motor subscore is 6.  Patient has mild general weakness, patient is able to pull himself to seated without assistance. No meningismus. Mild sedate however easily awaken to verbal.  Skin: Skin is warm. No rash noted.  Patient has multiple excoriations, partially open blisters and some full blisters to bilateral lower extremities. Most significantly patient has approximately 4 cm bullae to the dorsum lateral distal aspect of the left foot near the small toe with purple discoloration. Patient has equal strength bilateral lower extremity with mild general weakness. Mild erythema and warmth bilateral dorsum of the feet.  Psychiatric: He has a normal mood and affect.  Nursing note and vitals reviewed.   ED Course  Procedures (including critical care time) Labs Review Labs Reviewed  CBC WITH DIFFERENTIAL - Abnormal; Notable for the following:    RBC 3.70 (*)    Hemoglobin 11.4 (*)    HCT 35.2 (*)    RDW 17.2 (*)    Platelets 139 (*)    Neutrophils Relative % 82 (*)    Neutro Abs 8.5 (*)    All other components within normal limits  COMPREHENSIVE  METABOLIC PANEL - Abnormal; Notable for the following:    Sodium 135 (*)    Potassium 5.5 (*)    Chloride 95 (*)    Glucose, Bld 295 (*)    BUN 109 (*)    Creatinine, Ser 3.19 (*)    Albumin 3.1 (*)    GFR calc non Af Amer 17 (*)    GFR calc Af Amer 19 (*)    Anion gap 21 (*)    All other components within normal limits  SEDIMENTATION RATE - Abnormal; Notable for the following:    Sed Rate 59 (*)    All other components within normal limits  URINALYSIS, ROUTINE W REFLEX MICROSCOPIC - Abnormal; Notable for the following:    APPearance CLOUDY (*)    Glucose, UA 100 (*)    Hgb urine dipstick TRACE (*)    Leukocytes, UA SMALL (*)    All other components within normal limits  URINE MICROSCOPIC-ADD ON - Abnormal; Notable for the following:    Casts HYALINE CASTS (*)    All other components within normal limits  GLUCOSE, CAPILLARY -  Abnormal; Notable for the following:    Glucose-Capillary 198 (*)    All other components within normal limits  I-STAT CG4 LACTIC ACID, ED - Abnormal; Notable for the following:    Lactic Acid, Venous 3.63 (*)    All other components within normal limits  URINE CULTURE  CULTURE, BLOOD (ROUTINE X 2)  CULTURE, BLOOD (ROUTINE X 2)  MRSA PCR SCREENING  C-REACTIVE PROTEIN  CBC  BASIC METABOLIC PANEL  LACTIC ACID, PLASMA    Imaging Review Dg Chest 2 View  07/24/2014   CLINICAL DATA:  COPD, diabetes  EXAM: CHEST  2 VIEW  COMPARISON:  04/26/2010  FINDINGS: Persistent lingular scarring. Left lower lobe opacity which is increased compared with CT chest dated 04/25/2010. There is no pleural effusion or pneumothorax. Stable cardiomediastinal silhouette. Unremarkable osseous structures.  IMPRESSION: Left lower lobe airspace opacity which is increased compared with prior exams which may reflect scarring with concomitant atelectasis or pneumonia.   Electronically Signed   By: Elige Ko   On: 07/24/2014 22:13   Dg Foot 2 Views Left  07/24/2014   CLINICAL DATA:   Swelling and redness in bilateral feet. Open sores of the posterior distal tibia.  EXAM: LEFT FOOT - 2 VIEW  COMPARISON:  None.  FINDINGS: There is no evidence of fracture or dislocation. There is no evidence of arthropathy or other focal bone abnormality. There is soft tissue swelling over the dorsal midfoot. There is peripheral vascular atherosclerotic disease.  IMPRESSION: 1. No radiographic evidence of osteomyelitis of the left foot. Soft tissue swelling over the dorsal aspect of the left midfoot and plantar aspect of the left forefoot which may reflect cellulitis.   Electronically Signed   By: Elige Ko   On: 07/24/2014 22:09   Dg Foot 2 Views Right  07/24/2014   CLINICAL DATA:  Swelling and redness of bilateral feet  EXAM: RIGHT FOOT - 2 VIEW  COMPARISON:  None.  FINDINGS: There is no evidence of fracture or dislocation. There is no evidence of arthropathy or other focal bone abnormality. There is peripheral vascular atherosclerotic disease. Soft tissue swelling over the dorsal midfoot.  IMPRESSION: No radiographic evidence of osteomyelitis of the right foot. Soft tissue swelling over the dorsal midfoot.   Electronically Signed   By: Elige Ko   On: 07/24/2014 22:10     EKG Interpretation None      MDM   Final diagnoses:  Cough  Pain  Transient hypotension  Severe sepsis with acute organ dysfunction  Acute on chronic kidney failure  Cellulitis of left foot  Cellulitis of right foot  Non-intractable vomiting with nausea, vomiting of unspecified type   Concern for infection either urine versus skin with presentation. Lactate elevated 3.6, blood pressure in the room 80s systolic. IV fluid bolus given, IV antibiotics given, blood work reviewed. Hyperglycemia, patient currently on prednisone and methotrexate for skin disease. Patient high risk due to these medicines with likely immunosuppression. Vanco and Rocephin given, cefepime ordered for broader coverage. Repeat hospitalist for  admission.  The patients results and plan were reviewed and discussed.   Any x-rays performed were personally reviewed by myself.   Differential diagnosis were considered with the presenting HPI.  Medications  ceFEPIme (MAXIPIME) 2 g in dextrose 5 % 50 mL IVPB (2 g Intravenous Given 07/25/14 0039)  acetaminophen (TYLENOL) tablet 650 mg (not administered)  aspirin EC tablet 325 mg (325 mg Oral Not Given 07/25/14 0000)  HYDROcodone-acetaminophen (NORCO) 7.5-325 MG per tablet 1  tablet (not administered)  metoprolol succinate (TOPROL-XL) 24 hr tablet 50 mg (50 mg Oral Given 07/25/14 0039)  insulin glargine (LANTUS) injection 24 Units (24 Units Subcutaneous Given 07/24/14 2348)  sodium bicarbonate tablet 325 mg (not administered)  predniSONE (DELTASONE) tablet 5 mg (not administered)  pravastatin (PRAVACHOL) tablet 80 mg (not administered)  roflumilast (DALIRESP) tablet 500 mcg (not administered)  0.9 %  sodium chloride infusion ( Intravenous Rate/Dose Change 07/24/14 2230)  heparin injection 5,000 Units (5,000 Units Subcutaneous Given 07/25/14 0039)  sodium chloride 0.9 % injection 3 mL (3 mLs Intravenous Not Given 07/24/14 2200)  insulin aspart (novoLOG) injection 0-15 Units (not administered)  ceFEPIme (MAXIPIME) 1 g in dextrose 5 % 50 mL IVPB (not administered)  vancomycin (VANCOCIN) 2,000 mg in sodium chloride 0.9 % 500 mL IVPB (2,000 mg Intravenous New Bag/Given 07/24/14 2059)  sodium chloride 0.9 % bolus 1,000 mL (0 mLs Intravenous Stopped 07/24/14 2204)  cefTRIAXone (ROCEPHIN) 1 g in dextrose 5 % 50 mL IVPB (0 g Intravenous Stopped 07/24/14 2058)    Filed Vitals:   07/24/14 2206 07/24/14 2230 07/24/14 2255 07/25/14 0039  BP: 114/70 132/69 107/64 113/62  Pulse:   77 84  Temp:  98.1 F (36.7 C)    TempSrc:  Oral    Resp:  19 21   Height:   6\' 2"  (1.88 m)   Weight:   229 lb 11.5 oz (104.2 kg)   SpO2:   100%     Final diagnoses:  Cough  Pain  Transient hypotension  Severe sepsis  with acute organ dysfunction  Acute on chronic kidney failure  Cellulitis of left foot  Cellulitis of right foot  Non-intractable vomiting with nausea, vomiting of unspecified type    Admission/ observation were discussed with the admitting physician, patient and/or family and they are comfortable with the plan.     Enid Skeens, MD 07/25/14 [phone]

## 2014-07-24 NOTE — ED Notes (Signed)
Pt presents to department for evaluation of swelling to bilateral feet and lower legs. Open wounds noted to bilateral lower legs, redness and swelling also noted. Lower legs and feet painful to touch, 2+ pitting edema noted. Pt states numbness and tingling noted to both feet. Unable to palpate pedal pulses due to edema.

## 2014-07-25 DIAGNOSIS — N184 Chronic kidney disease, stage 4 (severe): Secondary | ICD-10-CM | POA: Diagnosis present

## 2014-07-25 DIAGNOSIS — L120 Bullous pemphigoid: Secondary | ICD-10-CM

## 2014-07-25 DIAGNOSIS — J449 Chronic obstructive pulmonary disease, unspecified: Secondary | ICD-10-CM | POA: Diagnosis present

## 2014-07-25 LAB — BASIC METABOLIC PANEL
ANION GAP: 17 — AB (ref 5–15)
BUN: 98 mg/dL — ABNORMAL HIGH (ref 6–23)
CHLORIDE: 103 meq/L (ref 96–112)
CO2: 19 meq/L (ref 19–32)
Calcium: 8.7 mg/dL (ref 8.4–10.5)
Creatinine, Ser: 3 mg/dL — ABNORMAL HIGH (ref 0.50–1.35)
GFR calc Af Amer: 21 mL/min — ABNORMAL LOW (ref >90)
GFR calc non Af Amer: 18 mL/min — ABNORMAL LOW (ref >90)
GLUCOSE: 147 mg/dL — AB (ref 70–99)
Potassium: 4.9 mEq/L (ref 3.7–5.3)
SODIUM: 139 meq/L (ref 137–147)

## 2014-07-25 LAB — CBC
HCT: 31.2 % — ABNORMAL LOW (ref 39.0–52.0)
Hemoglobin: 10.1 g/dL — ABNORMAL LOW (ref 13.0–17.0)
MCH: 30.7 pg (ref 26.0–34.0)
MCHC: 32.4 g/dL (ref 30.0–36.0)
MCV: 94.8 fL (ref 78.0–100.0)
PLATELETS: 123 10*3/uL — AB (ref 150–400)
RBC: 3.29 MIL/uL — AB (ref 4.22–5.81)
RDW: 17 % — ABNORMAL HIGH (ref 11.5–15.5)
WBC: 8 10*3/uL (ref 4.0–10.5)

## 2014-07-25 LAB — GLUCOSE, CAPILLARY
GLUCOSE-CAPILLARY: 208 mg/dL — AB (ref 70–99)
Glucose-Capillary: 90 mg/dL (ref 70–99)
Glucose-Capillary: 156 mg/dL — ABNORMAL HIGH (ref 70–99)
Glucose-Capillary: 186 mg/dL — ABNORMAL HIGH (ref 70–99)

## 2014-07-25 LAB — C-REACTIVE PROTEIN: CRP: 2.3 mg/dL — ABNORMAL HIGH (ref <0.60)

## 2014-07-25 LAB — LACTIC ACID, PLASMA: LACTIC ACID, VENOUS: 1.2 mmol/L (ref 0.5–2.2)

## 2014-07-25 LAB — MRSA PCR SCREENING: MRSA by PCR: NEGATIVE

## 2014-07-25 MED ORDER — HYDROCORTISONE NA SUCCINATE PF 100 MG IJ SOLR
100.0000 mg | Freq: Once | INTRAMUSCULAR | Status: AC
  Administered 2014-07-25: 100 mg via INTRAVENOUS
  Filled 2014-07-25: qty 2

## 2014-07-25 MED ORDER — VANCOMYCIN HCL 10 G IV SOLR
1500.0000 mg | INTRAVENOUS | Status: DC
  Administered 2014-07-26 – 2014-07-28 (×2): 1500 mg via INTRAVENOUS
  Filled 2014-07-25 (×3): qty 1500

## 2014-07-25 MED ORDER — HYDROCODONE-ACETAMINOPHEN 7.5-325 MG PO TABS
1.0000 | ORAL_TABLET | ORAL | Status: DC | PRN
  Administered 2014-07-25 – 2014-07-26 (×5): 1 via ORAL
  Filled 2014-07-25 (×7): qty 1

## 2014-07-25 MED ORDER — FAMOTIDINE 20 MG PO TABS
20.0000 mg | ORAL_TABLET | Freq: Every day | ORAL | Status: DC
  Administered 2014-07-25 – 2014-07-30 (×6): 20 mg via ORAL
  Filled 2014-07-25 (×6): qty 1

## 2014-07-25 MED ORDER — FOLIC ACID 1 MG PO TABS
1.0000 mg | ORAL_TABLET | Freq: Every morning | ORAL | Status: DC
  Administered 2014-07-25 – 2014-07-30 (×6): 1 mg via ORAL
  Filled 2014-07-25 (×6): qty 1

## 2014-07-25 MED ORDER — ALBUTEROL SULFATE (2.5 MG/3ML) 0.083% IN NEBU
2.5000 mg | INHALATION_SOLUTION | RESPIRATORY_TRACT | Status: DC | PRN
  Administered 2014-07-25: 2.5 mg via RESPIRATORY_TRACT
  Filled 2014-07-25: qty 3

## 2014-07-25 MED ORDER — PREDNISONE 5 MG PO TABS
5.0000 mg | ORAL_TABLET | Freq: Every day | ORAL | Status: DC
  Administered 2014-07-25 – 2014-07-29 (×5): 5 mg via ORAL
  Filled 2014-07-25 (×8): qty 1

## 2014-07-25 MED ORDER — PREDNISONE 20 MG PO TABS
20.0000 mg | ORAL_TABLET | Freq: Every day | ORAL | Status: DC
  Administered 2014-07-26 – 2014-07-30 (×5): 20 mg via ORAL
  Filled 2014-07-25 (×5): qty 1

## 2014-07-25 MED ORDER — SILVER SULFADIAZINE 1 % EX CREA
TOPICAL_CREAM | Freq: Every day | CUTANEOUS | Status: DC
  Administered 2014-07-25 – 2014-07-30 (×6): via TOPICAL
  Filled 2014-07-25 (×2): qty 85

## 2014-07-25 NOTE — Progress Notes (Signed)
On call provider notified about patient's SBP in 80s with MAP in low 60s. Patient is not symptomatic. Advised to call back if MAP decreases to <60. Will continue to monitor closely.

## 2014-07-25 NOTE — Progress Notes (Signed)
UR Completed.  Donalyn Schneeberger Jane [phone] 07/25/2014  

## 2014-07-25 NOTE — Care Management Note (Addendum)
    Page 1 of 1   08/02/2014     8:12:08 AM CARE MANAGEMENT NOTE 08/02/2014  Patient:  Dalton Lane,Dalton Lane   Account Number:  [id number]  Date Initiated:  07/25/2014  Documentation initiated by:  GRAVES-BIGELOW,BRENDA  Subjective/Objective Assessment:   Pt with Hx of COPD, diabetes, bullous skin disease- presents with lower extremity edema, vomiting, worsening blistering and rash to the lower extremities for the past week. Pt is from home with wife.     Action/Plan:   CM will speak to pt in regards to disposition needs. PT to consult. Pt may benefit from SNF at this time. Will f/u.   Anticipated DC Date:  07/28/2014   Anticipated DC Plan:  SKILLED NURSING FACILITY  In-house referral  Clinical Social Worker      DC Planning Services  CM consult      Choice offered to / List presented to:             Status of service:  Completed, signed off Medicare Important Message given?  YES (If response is "NO", the following Medicare IM given date fields will be blank) Date Medicare IM given:  07/27/2014 Medicare IM given by:  Letha CapeAYLOR,Jenniefer Salak Date Additional Medicare IM given:  07/30/2014 Additional Medicare IM given by:  Letha CapeEBORAH Francisco Ostrovsky  Discharge Disposition:  SKILLED NURSING FACILITY  Per UR Regulation:  Reviewed for med. necessity/level of care/duration of stay  If discussed at Long Length of Stay Meetings, dates discussed:    Comments:  07/26/14- 1500- Donn PieriniKristi Webster RN, BSN [phone] PT recommending SNF- CSW following up with pt/family regarding potential SNF placement  07-25-14 1506 Tomi BambergerBrenda Graves-Bigelow, RN,BSN [phone] CM was able to speak with wife before she left the hospital. Per wife pt has DME RW and pt will not use it. Wife has a manual wheel chair that husband pushes and walks behind. They have a family friend that is an aide that takes them to appointments. CM will continue to monitor.

## 2014-07-25 NOTE — Plan of Care (Signed)
Problem: Consults Goal: Skin Care Protocol Initiated - if Braden Score 18 or less If consults are not indicated, leave blank or document N/A Outcome: Progressing     

## 2014-07-25 NOTE — Plan of Care (Signed)
Problem: Consults Goal: General Medical Patient Education See Patient Education Module for specific education. Outcome: Completed/Met Date Met:  07/25/14 Goal: Skin Care Protocol Initiated - if Braden Score 18 or less If consults are not indicated, leave blank or document N/A Outcome: Progressing  Problem: Phase I Progression Outcomes Goal: Pain controlled with appropriate interventions Outcome: Completed/Met Date Met:  07/25/14

## 2014-07-25 NOTE — Consult Note (Signed)
WOC wound consult note Reason for Consult: evaluation of BLE wounds. Pt with history of bullous skin disease which is chronic.  Pt is followed by vascular MD (Dr. Sondra Com[facility]uz[place] [facility].  Recent ABIs obtained 07/24/14 R: 0.78, unable to obtain the left due to calcified and incompressible vessels. Palpable pulses but week, 2-3+ edema bilaterally. Pts wife at the bedside she has been treating the LE wounds for some time with Dial soap soaks, Betadine and witch hazel.  This has not been prescribed by any MD.   Pt admitted for worsening kidney failure and sepsis. Wound type: bullous lesions over the bilateral LEs including the dorsal left  foot. 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  Measurement: did not measure the areas on the legs they are circumferential  Right first metatarsal head: 2.0cm x 2.0cm x 0.2cm  Bulla left great toe/dorsal: did not measure.  Multiple toe tip ulcerations  Wound bed: there is an intact blood filled blister on the left dorsal foot, the right dorsal foot wound is dry with superficial depth and hyperkeratosis.  The leg wounds are mostly clean, minimal fibrin no purulent drainage  Drainage (amount, consistency, odor) see above, all serosanguinous  Periwound: some erythema consistent with cellulitis  Dressing procedure/placement/frequency: Silvadene for the LE wounds daily, cover with non adherent dressings.  Paint the toe tips and dorsal foot wounds with betadine daily to keep dry.  Prevalon boots to offload the heels and legs as he is such high risk for further breakdown.    Discussed POC with patient and bedside nurse.  Re consult if needed, will not follow at this time. Thanks  Keelyn Fjelstad [employer] RN, CWOCN [phone])

## 2014-07-25 NOTE — Progress Notes (Signed)
[facility] PROGRESS NOTE  Dalton Lane NWG:[id number] DOB: 1930-06-06 DOA: 07/24/2014 PCP: Alva GarnetSHELTON,KIMBERLY R., MD Summary 80 year old male with bullous pemphigoid presented with bilateral lower extremity cellulitis overlying his chronic bullous lesions. Blood pressure in the emergency room as low as 80/58.  Assessment/Plan:  Principal Problem:   Sepsis secondary to bilateral lower extremity cellulitis: Blood pressure currently 89/59. Takes 20 mg of prednisone in the morning, 5 mg in the afternoon per patient's wife. Will give a dose of hydrocortisone and monitor response. Patient is relatively asymptomatic with respect to the borderline blood pressure. Hold metoprolol. Continue vancomycin and cefepime. Continue step down monitoring. Active Problems:   Chronic kidney disease: Last creatinine on record was in 2011 and range between 2.1-2.4. Unclear whether creatinine of 3 is his new baseline versus acute renal failure. Will monitor.   Cellulitis bilateral feet and lower extremities: See above.   DM2 (diabetes mellitus, type 2):  Controlled.   Hyperkalemia:  Resolved   Bullous pemphigoid:  [facility] Dr. Wynelle ClevelandJoseph Jorizzo. Telephone number [phone] or [phone]. Will hold methotrexate in the setting of acute infection. Continue prednisone therapy.  Code Status:  full Family Communication:  Wife at bedside Disposition Plan:  home  Consultants:    Procedures:     Antibiotics:  Vancomycin cefepime 12/8 --  HPI/Subjective: Complaining of leg pain. Wants to get out of bed. No other complaints.  Objective: Filed Vitals:   07/25/14 0728  BP: 110/67  Pulse: 74  Temp: 98.7 F (37.1 C)  Resp: 23    Intake/Output Summary (Last 24 hours) at 07/25/14 0955 Last data filed at 07/25/14 0700  Gross per 24 hour  Intake 1784.58 ml  Output    750 ml  Net 1034.58 ml   Filed Weights   07/24/14 2255  Weight: 104.2 kg (229 lb 11.5 oz)     Exam:   General:  Elderly white male reading the newspaper. Oriented.  HEENT: Hard of hearing  Cardiovascular: Regular rate rhythm without murmurs gallops rubs  Respiratory: Clear to auscultation bilaterally without wheezes rhonchi or rales  Abdomen: Soft nontender nondistended  Ext: Multiple bullous lesions on his distal legs and feet some with underlying blood. Erythema, warmth, tenderness, boggy induration involving the feet and distal lower extremities  Basic Metabolic Panel:  Recent Labs Lab 07/24/14 1826 07/25/14 0220  NA 135* 139  K 5.5* 4.9  CL 95* 103  CO2 19 19  GLUCOSE 295* 147*  BUN 109* 98*  CREATININE 3.19* 3.00*  CALCIUM 9.2 8.7   Liver Function Tests:  Recent Labs Lab 07/24/14 1826  AST 11  ALT 21  ALKPHOS 70  BILITOT 0.4  PROT 6.5  ALBUMIN 3.1*   No results for input(s): LIPASE, AMYLASE in the last 168 hours. No results for input(s): AMMONIA in the last 168 hours. CBC:  Recent Labs Lab 07/24/14 1826 07/25/14 0220  WBC 10.4 8.0  NEUTROABS 8.5*  --   HGB 11.4* 10.1*  HCT 35.2* 31.2*  MCV 95.1 94.8  PLT 139* 123*   Cardiac Enzymes: No results for input(s): CKTOTAL, CKMB, CKMBINDEX, TROPONINI in the last 168 hours. BNP (last 3 results) No results for input(s): PROBNP in the last 8760 hours. CBG:  Recent Labs Lab 07/24/14 2348 07/25/14 0727  GLUCAP 198* 90    Recent Results (from the past 240 hour(s))  MRSA PCR Screening     Status: None   Collection Time: 07/24/14 10:58 PM  Result Value Ref Range Status  MRSA by PCR NEGATIVE NEGATIVE Final    Comment:        The GeneXpert MRSA Assay (FDA approved for NASAL specimens only), is one component of a comprehensive MRSA colonization surveillance program. It is not intended to diagnose MRSA infection nor to guide or monitor treatment for MRSA infections.      Studies: Dg Chest 2 View  07/24/2014   CLINICAL DATA:  COPD, diabetes  EXAM: CHEST  2 VIEW  COMPARISON:   04/26/2010  FINDINGS: Persistent lingular scarring. Left lower lobe opacity which is increased compared with CT chest dated 04/25/2010. There is no pleural effusion or pneumothorax. Stable cardiomediastinal silhouette. Unremarkable osseous structures.  IMPRESSION: Left lower lobe airspace opacity which is increased compared with prior exams which may reflect scarring with concomitant atelectasis or pneumonia.   Electronically Signed   By: Elige KoHetal  Patel   On: 07/24/2014 22:13   Dg Foot 2 Views Left  07/24/2014   CLINICAL DATA:  Swelling and redness in bilateral feet. Open sores of the posterior distal tibia.  EXAM: LEFT FOOT - 2 VIEW  COMPARISON:  None.  FINDINGS: There is no evidence of fracture or dislocation. There is no evidence of arthropathy or other focal bone abnormality. There is soft tissue swelling over the dorsal midfoot. There is peripheral vascular atherosclerotic disease.  IMPRESSION: 1. No radiographic evidence of osteomyelitis of the left foot. Soft tissue swelling over the dorsal aspect of the left midfoot and plantar aspect of the left forefoot which may reflect cellulitis.   Electronically Signed   By: Elige KoHetal  Patel   On: 07/24/2014 22:09   Dg Foot 2 Views Right  07/24/2014   CLINICAL DATA:  Swelling and redness of bilateral feet  EXAM: RIGHT FOOT - 2 VIEW  COMPARISON:  None.  FINDINGS: There is no evidence of fracture or dislocation. There is no evidence of arthropathy or other focal bone abnormality. There is peripheral vascular atherosclerotic disease. Soft tissue swelling over the dorsal midfoot.  IMPRESSION: No radiographic evidence of osteomyelitis of the right foot. Soft tissue swelling over the dorsal midfoot.   Electronically Signed   By: Elige KoHetal  Patel   On: 07/24/2014 22:10    Scheduled Meds: . aspirin EC  325 mg Oral Daily  . ceFEPime (MAXIPIME) IV  1 g Intravenous Q24H  . heparin  5,000 Units Subcutaneous 3 times per day  . hydrocortisone sod succinate (SOLU-CORTEF) inj  100  mg Intravenous Once  . insulin aspart  0-15 Units Subcutaneous TID WC  . insulin glargine  24 Units Subcutaneous QHS  . pravastatin  80 mg Oral Daily  . [START ON 07/26/2014] predniSONE  20 mg Oral Daily  . predniSONE  5 mg Oral Q supper  . roflumilast  500 mcg Oral Daily  . silver sulfADIAZINE   Topical Daily  . sodium bicarbonate  325 mg Oral Daily  . sodium chloride  3 mL Intravenous Q12H   Continuous Infusions: . sodium chloride 125 mL/hr at 07/25/14 0547    Time spent: 35 minutes  Rosan Calbert L  [facility] Pager [phone]. If 7PM-7AM, please contact night-coverage at [email or web address], password [email or web address] 07/25/2014, 9:55 AM  LOS: 1 day

## 2014-07-26 DIAGNOSIS — E1165 Type 2 diabetes mellitus with hyperglycemia: Secondary | ICD-10-CM

## 2014-07-26 DIAGNOSIS — L0390 Cellulitis, unspecified: Secondary | ICD-10-CM

## 2014-07-26 DIAGNOSIS — E1129 Type 2 diabetes mellitus with other diabetic kidney complication: Secondary | ICD-10-CM

## 2014-07-26 LAB — GLUCOSE, CAPILLARY
GLUCOSE-CAPILLARY: 117 mg/dL — AB (ref 70–99)
Glucose-Capillary: 182 mg/dL — ABNORMAL HIGH (ref 70–99)
Glucose-Capillary: 325 mg/dL — ABNORMAL HIGH (ref 70–99)
Glucose-Capillary: 295 mg/dL — ABNORMAL HIGH (ref 70–99)

## 2014-07-26 LAB — CBC
HCT: 30 % — ABNORMAL LOW (ref 39.0–52.0)
Hemoglobin: 9.8 g/dL — ABNORMAL LOW (ref 13.0–17.0)
MCH: 31 pg (ref 26.0–34.0)
MCHC: 32.7 g/dL (ref 30.0–36.0)
MCV: 94.9 fL (ref 78.0–100.0)
Platelets: 106 10*3/uL — ABNORMAL LOW (ref 150–400)
RBC: 3.16 MIL/uL — AB (ref 4.22–5.81)
RDW: 16.8 % — ABNORMAL HIGH (ref 11.5–15.5)
WBC: 7.1 10*3/uL (ref 4.0–10.5)

## 2014-07-26 LAB — BASIC METABOLIC PANEL
Anion gap: 16 — ABNORMAL HIGH (ref 5–15)
BUN: 99 mg/dL — ABNORMAL HIGH (ref 6–23)
CHLORIDE: 103 meq/L (ref 96–112)
CO2: 17 mEq/L — ABNORMAL LOW (ref 19–32)
Calcium: 8.9 mg/dL (ref 8.4–10.5)
Creatinine, Ser: 2.9 mg/dL — ABNORMAL HIGH (ref 0.50–1.35)
GFR calc Af Amer: 22 mL/min — ABNORMAL LOW (ref >90)
GFR, EST NON AFRICAN AMERICAN: 19 mL/min — AB (ref >90)
Glucose, Bld: 148 mg/dL — ABNORMAL HIGH (ref 70–99)
POTASSIUM: 5.1 meq/L (ref 3.7–5.3)
SODIUM: 136 meq/L — AB (ref 137–147)

## 2014-07-26 LAB — URINE CULTURE: Colony Count: >=100000

## 2014-07-26 MED ORDER — MORPHINE SULFATE 2 MG/ML IJ SOLN: 0.5000 mg | INTRAMUSCULAR | Status: DC | PRN

## 2014-07-26 MED ORDER — ONDANSETRON HCL 4 MG/2ML IJ SOLN
4.0000 mg | Freq: Four times a day (QID) | INTRAMUSCULAR | Status: DC | PRN
  Administered 2014-07-27 – 2014-07-29 (×3): 4 mg via INTRAVENOUS
  Filled 2014-07-26 (×4): qty 2

## 2014-07-26 MED ORDER — POLYETHYLENE GLYCOL 3350 17 G PO PACK
17.0000 g | PACK | Freq: Every day | ORAL | Status: DC
  Administered 2014-07-26 – 2014-07-29 (×4): 17 g via ORAL
  Filled 2014-07-26 (×5): qty 1

## 2014-07-26 MED ORDER — SENNOSIDES-DOCUSATE SODIUM 8.6-50 MG PO TABS
1.0000 | ORAL_TABLET | Freq: Two times a day (BID) | ORAL | Status: DC
  Administered 2014-07-26 – 2014-07-29 (×7): 1 via ORAL
  Filled 2014-07-26 (×8): qty 1

## 2014-07-26 MED ORDER — CALCIUM CARBONATE ANTACID 500 MG PO CHEW
1.0000 | CHEWABLE_TABLET | Freq: Three times a day (TID) | ORAL | Status: DC | PRN
  Filled 2014-07-26: qty 1

## 2014-07-26 NOTE — Evaluation (Signed)
Physical Therapy Evaluation Patient Details Name: Dalton Lane MRN: [id number] DOB: 01-16-1930 Today's Date: 07/26/2014   History of Present Illness  80 year old male with PMH: CKD, DM2, hyperkalemia, bullous pemphigoid, presented with bilateral lower extremity cellulitis overlying his chronic bullous lesions    Clinical Impression  Patient did not sleep well last night and was very resistant to therapy but agreeable to very limited activity, he wanted to be left alone to take a nap.  P.T. recommended SNF, which patient stated he would not do again (has been in SNF in the past).  Pt activity limited primarily by agitation today, he stood and took two steps forward and immediately sat again, asking to be left alone.  Encouraged activity, but pt remained resistant throughout session.  Bilateral LE strength at least 3/5 on myotome testing, pt unwilling/unable to perform resisted testing.  Pt would benefit from skilled therapy to address limitations in functional mobility in order to decrease burden of care and return to PLOF.          Follow Up Recommendations Supervision for mobility/OOB;SNF    Equipment Recommendations  3in1 (PT)    Recommendations for Other Services OT consult     Precautions / Restrictions Precautions Precautions: Fall Restrictions Weight Bearing Restrictions: No      Mobility  Bed Mobility               General bed mobility comments: pt seated in chair on arrival, returned to chair at end of session  Transfers Overall transfer level: Needs assistance Equipment used: Rolling walker (2 wheeled) Transfers: Sit to/from Stand Sit to Stand: Mod assist         General transfer comment: Mod physical assist to power up to full stand.  Max verbal cues for body position, hand position, and anterior translation to acheive stand.  Pt required 3 attempts to complete this transfer.  Ambulation/Gait                Stairs            Wheelchair  Mobility    Modified Rankin (Stroke Patients Only)       Balance Overall balance assessment: Needs assistance Sitting-balance support: Feet supported;No upper extremity supported Sitting balance-Leahy Scale: Fair     Standing balance support: Bilateral upper extremity supported;During functional activity Standing balance-Leahy Scale: Poor                               Pertinent Vitals/Pain Pain Assessment: No/denies pain (but requesting pain medication)    Home Living Family/patient expects to be discharged to:: Private residence Living Arrangements: Spouse/significant other Available Help at Discharge: Family;Available PRN/intermittently Type of Home: House Home Access: Level entry     Home Layout: One level Home Equipment: Walker - 2 wheels;Wheelchair - manual      Prior Function           Comments: wife is in a WC and helps pt     Hand Dominance        Extremity/Trunk Assessment   Upper Extremity Assessment: Defer to OT evaluation           Lower Extremity Assessment: Generalized weakness      Cervical / Trunk Assessment: Kyphotic  Communication   Communication: HOH  Cognition Arousal/Alertness: Lethargic Behavior During Therapy: Agitated Overall Cognitive Status: Within Functional Limits for tasks assessed  General Comments      Exercises        Assessment/Plan    PT Assessment Patient needs continued PT services  PT Diagnosis Difficulty walking;Generalized weakness   PT Problem List Decreased strength;Decreased activity tolerance;Decreased balance;Decreased mobility;Decreased safety awareness;Decreased skin integrity  PT Treatment Interventions DME instruction;Gait training;Functional mobility training;Therapeutic activities;Therapeutic exercise;Balance training;Patient/family education   PT Goals (Current goals can be found in the Care Plan section) Acute Rehab PT Goals Patient Stated  Goal: none stated    Frequency Min 3X/week   Barriers to discharge Decreased caregiver support Wife in w/c, unable to care for pt    Co-evaluation               End of Session   Activity Tolerance: Treatment limited secondary to agitation;Patient limited by lethargy Patient left: in chair;with family/visitor present;with call bell/phone within reach           Time: 1112-1125 PT Time Calculation (min) (ACUTE ONLY): 13 min   Charges:   PT Evaluation $Initial PT Evaluation Tier I: 1 Procedure PT Treatments $Therapeutic Activity: 8-22 mins   PT G Codes:          Taveon Enyeart SPT 07/26/2014, 11:47 AM

## 2014-07-26 NOTE — Clinical Social Work Psychosocial (Signed)
Clinical Social Work Department BRIEF PSYCHOSOCIAL ASSESSMENT 07/26/2014  Patient:  Dalton Lane, Dalton Lane     Account Number:  [id number]     Admit date:  07/24/2014  Clinical Social Worker:  Marciano Sequin  Date/Time:  07/26/2014 05:01 PM  Referred by:  RN  Date Referred:  07/26/2014 Referred for  SNF Placement   Other Referral:   Interview type:  Patient Other interview type:    PSYCHOSOCIAL DATA Living Status:  WIFE Admitted from facility:   Level of care:   Primary support name:  Urbani,Dalton Lane Primary support relationship to patient:  SPOUSE Degree of support available:    CURRENT CONCERNS Current Concerns  None Noted   Other Concerns:    SOCIAL WORK ASSESSMENT / PLAN CSW met pt and the pt's wife [name] at bedside. CSW introduced self and purpose of visit. CSW and Arville Go discussed the clinical team recommendations for rehab. Arville Go reported that she does not want the pt to received rehab from [facility]. CSW explained the SNF process to [facility].  CSW provided Arville Go with a SNF list. CSW and Arville Go reviewed the list. CSW answered all questions in which [place] inquired about. CSW provided [name] with contact information for further questions. CSW will continue to follow this pt and assist with discharge as needed.   Assessment/plan status:  Psychosocial Support/Ongoing Assessment of Needs Other assessment/ plan:   Information/referral to community resources:    PATIENT'S/FAMILY'S RESPONSE TO PLAN OF CARE: Arville Go was receptive and pleasent. It appears Arville Go wants the pt to received rehab before he comes home. [name] reported needing to dicussed the SNF list with a friend.   [place], MSW, [place]

## 2014-07-26 NOTE — Progress Notes (Signed)
New orders to transfer to 5W35 via bed with belongings. Wife at bedside with personal aid.

## 2014-07-26 NOTE — Plan of Care (Signed)
Problem: Phase I Progression Outcomes Goal: OOB as tolerated unless otherwise ordered Outcome: Completed/Met Date Met:  07/26/14 Goal: Initial discharge plan identified Outcome: Completed/Met Date Met:  07/26/14 Goal: Voiding-avoid urinary catheter unless indicated Outcome: Completed/Met Date Met:  07/26/14 Goal: Hemodynamically stable Outcome: Completed/Met Date Met:  07/26/14

## 2014-07-26 NOTE — Progress Notes (Signed)
Pt arrived to the unit.

## 2014-07-26 NOTE — Clinical Social Work Placement (Signed)
Clinical Social Work Department CLINICAL SOCIAL WORK PLACEMENT NOTE 07/26/2014  Patient:  Dalton HewsFARMER,[name]  Account Number:  [id number] Admit date:  07/24/2014  Clinical Social Worker:  Gwynne EdingerYSHEKA Avel Ogawa, LCSWA  Date/time:  07/26/2014 05:12 PM  Clinical Social Work is seeking post-discharge placement for this patient at the following level of care:   SKILLED NURSING   (*CSW will update this form in Epic as items are completed)   07/26/2014  Patient/family provided with [facility] of Clinical Social Work's list of facilities offering this level of care within the geographic area requested by the patient (or if unable, by the patient's family).  07/26/2014  Patient/family informed of their freedom to choose among providers that offer the needed level of care, that participate in Medicare, Medicaid or managed care program needed by the patient, have an available bed and are willing to accept the patient.  07/26/2014  Patient/family informed of [facility]' ownership interest in [facility], as well as of the fact that they are under no obligation to receive care at this facility.  PASARR submitted to EDS on 04/28/2010 PASARR number received on 04/29/2010  FL2 transmitted to all facilities in geographic area requested by pt/family on  07/26/2014 FL2 transmitted to all facilities within larger geographic area on 07/26/2014  Patient informed that his/her managed care company has contracts with or will negotiate with  certain facilities, including the following:     Patient/family informed of bed offers received:   Patient chooses bed at  Physician recommends and patient chooses bed at    Patient to be transferred to  on   Patient to be transferred to facility by  Patient and family notified of transfer on  Name of family member notified:    The following physician request were entered in Epic:   Additional Comments:  Carri Spillers, MSW, LCSWA [phone]

## 2014-07-26 NOTE — Progress Notes (Signed)
[facility] PROGRESS NOTE  Dalton Lane UJW:[id number]:[id number] DOB: 1929-08-25 DOA: 07/24/2014 PCP: Alva GarnetSHELTON,KIMBERLY R., MD  Summary 80 year old male with bullous pemphigoid presented with bilateral lower extremity cellulitis overlying his chronic bullous lesions. Blood pressure in the emergency room as low as 80/58.  Assessment/Plan:    Sepsis secondary to bilateral lower extremity cellulitis:  -given 1 dose of hydrocortisone  -BP better -Hold metoprolol.  -vancomycin and cefepime. Continue step down monitoring for now    Chronic kidney disease: Last creatinine on record was in 2011 and range between 2.1-2.4. Unclear whether creatinine of 3 is his new baseline versus acute renal failure. Will monitor.   Cellulitis bilateral feet and lower extremities: See above.   DM2 (diabetes mellitus, type 2):  Controlled.   Hyperkalemia:  Resolved   Bullous pemphigoid:  [facility] by Dr. Wynelle ClevelandJoseph Jorizzo. Telephone number [phone] or [phone]. Will hold methotrexate in the setting of acute infection. Continue prednisone therapy.  Wife appears debilitated, PT eval pending  Code Status:  full Family Communication:  No family at bedside Disposition Plan:  home  Consultants:    Procedures:     Antibiotics:  Vancomycin cefepime 12/8 --  HPI/Subjective: No overnight events  Objective: Filed Vitals:   07/26/14 0734  BP: 117/68  Pulse: 90  Temp: 97.9 F (36.6 C)  Resp: 20    Intake/Output Summary (Last 24 hours) at 07/26/14 [id number] Last data filed at 07/26/14 [id number]  Gross per 24 hour  Intake 981.59 ml  Output    675 ml  Net 306.59 ml   Filed Weights   07/24/14 2255  Weight: 104.2 kg (229 lb 11.5 oz)    Exam:   General:  Elderly white male- NAD  HEENT: Hard of hearing  Cardiovascular: Regular rate rhythm without murmurs gallops rubs  Respiratory: Clear to auscultation bilaterally without wheezes rhonchi or rales  Abdomen: Soft nontender  nondistended  Ext: Multiple bullous lesions on his distal legs and feet some with underlying blood. Erythema, warmth, tenderness, boggy induration involving the feet and distal lower extremities  Basic Metabolic Panel:  Recent Labs Lab 07/24/14 1826 07/25/14 0220 07/26/14 0340  NA 135* 139 136*  K 5.5* 4.9 5.1  CL 95* 103 103  CO2 19 19 17*  GLUCOSE 295* 147* 148*  BUN 109* 98* 99*  CREATININE 3.19* 3.00* 2.90*  CALCIUM 9.2 8.7 8.9   Liver Function Tests:  Recent Labs Lab 07/24/14 1826  AST 11  ALT 21  ALKPHOS 70  BILITOT 0.4  PROT 6.5  ALBUMIN 3.1*   No results for input(s): LIPASE, AMYLASE in the last 168 hours. No results for input(s): AMMONIA in the last 168 hours. CBC:  Recent Labs Lab 07/24/14 1826 07/25/14 0220 07/26/14 0340  WBC 10.4 8.0 7.1  NEUTROABS 8.5*  --   --   HGB 11.4* 10.1* 9.8*  HCT 35.2* 31.2* 30.0*  MCV 95.1 94.8 94.9  PLT 139* 123* 106*   Cardiac Enzymes: No results for input(s): CKTOTAL, CKMB, CKMBINDEX, TROPONINI in the last 168 hours. BNP (last 3 results) No results for input(s): PROBNP in the last 8760 hours. CBG:  Recent Labs Lab 07/24/14 2348 07/25/14 0727 07/25/14 1208 07/25/14 1558 07/25/14 2137  GLUCAP 198* 90 156* 186* 208*    Recent Results (from the past 240 hour(s))  Urine culture     Status: None   Collection Time: 07/24/14 10:57 PM  Result Value Ref Range Status   Specimen Description URINE, CLEAN CATCH  Final  Special Requests NONE  Final   Culture  Setup Time   Final    07/25/2014 04:13 Performed at [facility]    Colony Count   Final    >=100,000 COLONIES/ML Performed at [facility]    Culture   Final    Multiple bacterial morphotypes present, none predominant. Suggest appropriate recollection if clinically indicated. Performed at [facility]    Report Status 07/26/2014 FINAL  Final  MRSA PCR Screening     Status: None   Collection Time: 07/24/14 10:58 PM  Result  Value Ref Range Status   MRSA by PCR NEGATIVE NEGATIVE Final    Comment:        The GeneXpert MRSA Assay (FDA approved for NASAL specimens only), is one component of a comprehensive MRSA colonization surveillance program. It is not intended to diagnose MRSA infection nor to guide or monitor treatment for MRSA infections.      Studies: Dg Chest 2 View  07/24/2014   CLINICAL DATA:  COPD, diabetes  EXAM: CHEST  2 VIEW  COMPARISON:  04/26/2010  FINDINGS: Persistent lingular scarring. Left lower lobe opacity which is increased compared with CT chest dated 04/25/2010. There is no pleural effusion or pneumothorax. Stable cardiomediastinal silhouette. Unremarkable osseous structures.  IMPRESSION: Left lower lobe airspace opacity which is increased compared with prior exams which may reflect scarring with concomitant atelectasis or pneumonia.   Electronically Signed   By: Elige KoHetal  Patel   On: 07/24/2014 22:13   Dg Foot 2 Views Left  07/24/2014   CLINICAL DATA:  Swelling and redness in bilateral feet. Open sores of the posterior distal tibia.  EXAM: LEFT FOOT - 2 VIEW  COMPARISON:  None.  FINDINGS: There is no evidence of fracture or dislocation. There is no evidence of arthropathy or other focal bone abnormality. There is soft tissue swelling over the dorsal midfoot. There is peripheral vascular atherosclerotic disease.  IMPRESSION: 1. No radiographic evidence of osteomyelitis of the left foot. Soft tissue swelling over the dorsal aspect of the left midfoot and plantar aspect of the left forefoot which may reflect cellulitis.   Electronically Signed   By: Elige KoHetal  Patel   On: 07/24/2014 22:09   Dg Foot 2 Views Right  07/24/2014   CLINICAL DATA:  Swelling and redness of bilateral feet  EXAM: RIGHT FOOT - 2 VIEW  COMPARISON:  None.  FINDINGS: There is no evidence of fracture or dislocation. There is no evidence of arthropathy or other focal bone abnormality. There is peripheral vascular atherosclerotic  disease. Soft tissue swelling over the dorsal midfoot.  IMPRESSION: No radiographic evidence of osteomyelitis of the right foot. Soft tissue swelling over the dorsal midfoot.   Electronically Signed   By: Elige KoHetal  Patel   On: 07/24/2014 22:10    Scheduled Meds: . aspirin EC  325 mg Oral Daily  . ceFEPime (MAXIPIME) IV  1 g Intravenous Q24H  . famotidine  20 mg Oral Daily  . folic acid  1 mg Oral q morning - 10a  . heparin  5,000 Units Subcutaneous 3 times per day  . insulin aspart  0-15 Units Subcutaneous TID WC  . insulin glargine  24 Units Subcutaneous QHS  . pravastatin  80 mg Oral Daily  . predniSONE  20 mg Oral Daily  . predniSONE  5 mg Oral Q supper  . roflumilast  500 mcg Oral Daily  . silver sulfADIAZINE   Topical Daily  . sodium bicarbonate  325 mg Oral  Daily  . sodium chloride  3 mL Intravenous Q12H  . vancomycin  1,500 mg Intravenous Q48H   Continuous Infusions: . sodium chloride Stopped (07/25/14 2340)    Time spent: 35 minutes  Clemencia Helzer  [facility] Pager [phone]. If 7PM-7AM, please contact night-coverage at [email or web address], password [facility] 07/26/2014, 8:14 AM  LOS: 2 days

## 2014-07-27 LAB — GLUCOSE, CAPILLARY
GLUCOSE-CAPILLARY: 235 mg/dL — AB (ref 70–99)
GLUCOSE-CAPILLARY: 134 mg/dL — AB (ref 70–99)
Glucose-Capillary: 173 mg/dL — ABNORMAL HIGH (ref 70–99)
Glucose-Capillary: 188 mg/dL — ABNORMAL HIGH (ref 70–99)

## 2014-07-27 MED ORDER — BISACODYL 10 MG RE SUPP
10.0000 mg | Freq: Every day | RECTAL | Status: DC | PRN
  Administered 2014-07-27: 10 mg via RECTAL
  Filled 2014-07-27 (×2): qty 1

## 2014-07-27 NOTE — Progress Notes (Signed)
ANTIBIOTIC CONSULT NOTE - FOLLOW UP  Pharmacy Consult for Vancomycin and Cefepime Indication: bilateral cellulitis  Allergies  Allergen Reactions  . Amoxicillin Itching    Patient Measurements: Height: 6\' 2"  (188 cm) Weight: 229 lb 11.5 oz (104.2 kg) IBW/kg (Calculated) : 82.2  Vital Signs: Temp: 98.4 F (36.9 C) (12/11 1323) Temp Source: Oral (12/11 0555) BP: 131/70 mmHg (12/11 1323) Pulse Rate: 86 (12/11 1323) Intake/Output from previous day: 12/10 0701 - 12/11 0700 In: 1203 [P.O.:1200; I.V.:3] Out: 1100 [Urine:1100] Intake/Output from this shift: Total I/O In: 483 [P.O.:480; I.V.:3] Out: 750 [Urine:750]  Labs:  Recent Labs  07/24/14 1826 07/25/14 0220 07/26/14 0340  WBC 10.4 8.0 7.1  HGB 11.4* 10.1* 9.8*  PLT 139* 123* 106*  CREATININE 3.19* 3.00* 2.90*   Estimated Creatinine Clearance: 25.3 mL/min (by C-G formula based on Cr of 2.9).   Assessment:  Day # 4 Vanc and # 3 Cefepime  (after 1 day of Ceftriaxone) for bilateral LE cellulitis.   Afebrile, WBC 7.1 on 07/26/14. On chronic Prednisone.  Urine culture with multiple species; blood cultures negative to date.  No chemistries today, for recheck in am.  Goal of Therapy:  Vancomycin trough level 10-15 mcg/ml appropriate Cefepime dose for renal function and infection  Plan:   Continue Vancomycin 1500 mg IV q48hrs. Next dose due 12/12 at 9pm.  Continue Cefepime 1 gram IV q24hrs.  Follow renal function, CBC, culture data, progress.  Will consider checking Vanc trough level by 12/14.  Dennie FettersEgan, Shagun Wordell Donovan, RPh Pager: [phone] 07/27/2014,2:52 PM

## 2014-07-27 NOTE — Progress Notes (Signed)
Inpatient Diabetes Program Recommendations  AACE/ADA: New Consensus Statement on Inpatient Glycemic Control (2013)  Target Ranges:  Prepandial:   less than 140 mg/dL      Peak postprandial:   less than 180 mg/dL (1-2 hours)      Critically ill patients:  140 - 180 mg/dL   Results for Dalton Lane, Dalton Lane (MRN [id number]) as of 07/27/2014 12:38  Ref. Range 07/26/2014 12:39 07/26/2014 17:08 07/26/2014 22:37 07/27/2014 07:55 07/27/2014 12:01  Glucose-Capillary Latest Range: 70-99 mg/dL 409182 (H) 811325 (H) 914295 (H) 235 (H) 134 (H)   Reason for assessment: elevated CBG  Diabetes history: Type 2 Outpatient Diabetes medications: Tujeo 20 units Current orders for Inpatient glycemic control:  Lantus 24 units qday, Novolog moderate correction tid  May want to consider adding hs Novolog correction; 10pm CBG 295mg /dl and fasting CBG 235mg /dl- giving a small dose of Novolog at hs will improve the fasting number.  Dalton RacerJulie Taysha Majewski, RN, BA, MHA, CDE Diabetes Coordinator Inpatient Diabetes Program  [phone] (Team Pager) [phone] Dalton Lane([place] Office) 07/27/2014 12:51 PM

## 2014-07-27 NOTE — Progress Notes (Signed)
[facility] PROGRESS NOTE  Dalton HewsWilliam C Lane UJW:[id number] DOB: 1930/03/27 DOA: 07/24/2014 PCP: Alva GarnetSHELTON,KIMBERLY R., MD  Summary 80 year old male with bullous pemphigoid presented with bilateral lower extremity cellulitis overlying his chronic bullous lesions. Blood pressure in the emergency room as low as 80/58.  Assessment/Plan:    Sepsis secondary to bilateral lower extremity cellulitis:  -given 1 dose of hydrocortisone  -BP better -Hold metoprolol.  -vancomycin and cefepime.     Chronic kidney disease: Last creatinine on record was in 2011 and range between 2.1-2.4. ? new baseline versus acute renal failure. Will monitor.   Cellulitis bilateral feet and lower extremities: See above.   DM2 (diabetes mellitus, type 2):  Controlled.   Hyperkalemia:  Resolved   Bullous pemphigoid:  [facility] by Dr. Wynelle ClevelandJoseph Jorizzo. Telephone number [phone] or [phone]. Will hold methotrexate in the setting of acute infection. Continue prednisone therapy.    Code Status:  full Family Communication:  Called wife 12/11 Disposition Plan:  SNF  Consultants:    Procedures:     Antibiotics:  Vancomycin cefepime 12/8 --  HPI/Subjective: No overnight events  Objective: Filed Vitals:   07/27/14 0555  BP: 129/70  Pulse: 78  Temp: 98.1 F (36.7 C)  Resp: 20    Intake/Output Summary (Last 24 hours) at 07/27/14 [id number] Last data filed at 07/27/14 [id number]  Gross per 24 hour  Intake    846 ml  Output   1100 ml  Net   -254 ml   Filed Weights   07/24/14 2255  Weight: 104.2 kg (229 lb 11.5 oz)    Exam:   General:  Elderly white male- NAD  HEENT: Hard of hearing  Cardiovascular: Regular rate rhythm without murmurs gallops rubs  Respiratory: Clear to auscultation bilaterally without wheezes rhonchi or rales  Abdomen: Soft nontender nondistended  Ext: Multiple bullous lesions on his distal legs and feet some with underlying blood. Erythema,  warmth, tenderness, boggy induration involving the feet and distal lower extremities  Basic Metabolic Panel:  Recent Labs Lab 07/24/14 1826 07/25/14 0220 07/26/14 0340  NA 135* 139 136*  K 5.5* 4.9 5.1  CL 95* 103 103  CO2 19 19 17*  GLUCOSE 295* 147* 148*  BUN 109* 98* 99*  CREATININE 3.19* 3.00* 2.90*  CALCIUM 9.2 8.7 8.9   Liver Function Tests:  Recent Labs Lab 07/24/14 1826  AST 11  ALT 21  ALKPHOS 70  BILITOT 0.4  PROT 6.5  ALBUMIN 3.1*   No results for input(s): LIPASE, AMYLASE in the last 168 hours. No results for input(s): AMMONIA in the last 168 hours. CBC:  Recent Labs Lab 07/24/14 1826 07/25/14 0220 07/26/14 0340  WBC 10.4 8.0 7.1  NEUTROABS 8.5*  --   --   HGB 11.4* 10.1* 9.8*  HCT 35.2* 31.2* 30.0*  MCV 95.1 94.8 94.9  PLT 139* 123* 106*   Cardiac Enzymes: No results for input(s): CKTOTAL, CKMB, CKMBINDEX, TROPONINI in the last 168 hours. BNP (last 3 results) No results for input(s): PROBNP in the last 8760 hours. CBG:  Recent Labs Lab 07/26/14 0746 07/26/14 1239 07/26/14 1708 07/26/14 2237 07/27/14 0755  GLUCAP 117* 182* 325* 295* 235*    Recent Results (from the past 240 hour(s))  Blood culture (routine x 2)     Status: None (Preliminary result)   Collection Time: 07/24/14  8:16 PM  Result Value Ref Range Status   Specimen Description BLOOD LEFT ARM  Final   Special Requests BOTTLES DRAWN AEROBIC  AND ANAEROBIC 5CC  Final   Culture  Setup Time   Final    07/25/2014 00:57 Performed at [facility]    Culture   Final           BLOOD CULTURE RECEIVED NO GROWTH TO DATE CULTURE WILL BE HELD FOR 5 DAYS BEFORE ISSUING A FINAL NEGATIVE REPORT Performed at [facility]    Report Status PENDING  Incomplete  Blood culture (routine x 2)     Status: None (Preliminary result)   Collection Time: 07/24/14  8:18 PM  Result Value Ref Range Status   Specimen Description BLOOD RIGHT HAND  Final   Special Requests BOTTLES DRAWN  AEROBIC ONLY 2CC  Final   Culture  Setup Time   Final    07/25/2014 00:57 Performed at [facility]    Culture   Final           BLOOD CULTURE RECEIVED NO GROWTH TO DATE CULTURE WILL BE HELD FOR 5 DAYS BEFORE ISSUING A FINAL NEGATIVE REPORT Performed at [facility]    Report Status PENDING  Incomplete  Urine culture     Status: None   Collection Time: 07/24/14 10:57 PM  Result Value Ref Range Status   Specimen Description URINE, CLEAN CATCH  Final   Special Requests NONE  Final   Culture  Setup Time   Final    07/25/2014 04:13 Performed at [facility]    Colony Count   Final    >=100,000 COLONIES/ML Performed at [facility]    Culture   Final    Multiple bacterial morphotypes present, none predominant. Suggest appropriate recollection if clinically indicated. Performed at [facility]    Report Status 07/26/2014 FINAL  Final  MRSA PCR Screening     Status: None   Collection Time: 07/24/14 10:58 PM  Result Value Ref Range Status   MRSA by PCR NEGATIVE NEGATIVE Final    Comment:        The GeneXpert MRSA Assay (FDA approved for NASAL specimens only), is one component of a comprehensive MRSA colonization surveillance program. It is not intended to diagnose MRSA infection nor to guide or monitor treatment for MRSA infections.      Studies: No results found.  Scheduled Meds: . aspirin EC  325 mg Oral Daily  . ceFEPime (MAXIPIME) IV  1 g Intravenous Q24H  . famotidine  20 mg Oral Daily  . folic acid  1 mg Oral q morning - 10a  . heparin  5,000 Units Subcutaneous 3 times per day  . insulin aspart  0-15 Units Subcutaneous TID WC  . insulin glargine  24 Units Subcutaneous QHS  . polyethylene glycol  17 g Oral Daily  . pravastatin  80 mg Oral Daily  . predniSONE  20 mg Oral Daily  . predniSONE  5 mg Oral Q supper  . roflumilast  500 mcg Oral Daily  . senna-docusate  1 tablet Oral BID  . silver sulfADIAZINE   Topical Daily   . sodium bicarbonate  325 mg Oral Daily  . sodium chloride  3 mL Intravenous Q12H  . vancomycin  1,500 mg Intravenous Q48H   Continuous Infusions: . sodium chloride 10 mL/hr at 07/26/14 2152    Time spent: 35 minutes  Marlin CanaryVANN, JESSICA  [facility] Pager [phone]. If 7PM-7AM, please contact night-coverage at [email or web address], password [email or web address] 07/27/2014, 9:03 AM  LOS: 3 days

## 2014-07-27 NOTE — Progress Notes (Signed)
Patient reported that he has't feeling well after an episode of emesis. Emesis appears with chunkful of eggs and sausage from breakfast. Zofran given. Patient abdomen appears distended with positive bowel sounds. Patient denied any abdominal pain. MD notified and order received. Will continue to monitor,  Sim BoastHavy, RN

## 2014-07-27 NOTE — Clinical Social Work Note (Signed)
Bed offers given to wife. CSW encouraged wife to think about which facility she would want the patient to DC to.    Roddie McBryant Marye Eagen MSW, [place]WA, [place], [phone]

## 2014-07-27 NOTE — Progress Notes (Signed)
Medicare Important Message given?  YES (If response is "NO", the following Medicare IM given date fields will be blank) Date Medicare IM given:   Medicare IM given by:  Mattilynn Forrer 

## 2014-07-28 LAB — CBC
HCT: 30.8 % — ABNORMAL LOW (ref 39.0–52.0)
Hemoglobin: 9.9 g/dL — ABNORMAL LOW (ref 13.0–17.0)
MCH: 32 pg (ref 26.0–34.0)
MCHC: 32.1 g/dL (ref 30.0–36.0)
MCV: 99.7 fL (ref 78.0–100.0)
PLATELETS: 104 10*3/uL — AB (ref 150–400)
RBC: 3.09 MIL/uL — AB (ref 4.22–5.81)
RDW: 17.1 % — ABNORMAL HIGH (ref 11.5–15.5)
WBC: 6.1 10*3/uL (ref 4.0–10.5)

## 2014-07-28 LAB — BASIC METABOLIC PANEL
Anion gap: 14 (ref 5–15)
BUN: 70 mg/dL — ABNORMAL HIGH (ref 6–23)
CALCIUM: 9.4 mg/dL (ref 8.4–10.5)
CO2: 20 mEq/L (ref 19–32)
Chloride: 109 mEq/L (ref 96–112)
Creatinine, Ser: 2.21 mg/dL — ABNORMAL HIGH (ref 0.50–1.35)
GFR calc Af Amer: 30 mL/min — ABNORMAL LOW (ref >90)
GFR, EST NON AFRICAN AMERICAN: 26 mL/min — AB (ref >90)
Glucose, Bld: 103 mg/dL — ABNORMAL HIGH (ref 70–99)
POTASSIUM: 5.3 meq/L (ref 3.7–5.3)
Sodium: 143 mEq/L (ref 137–147)

## 2014-07-28 LAB — GLUCOSE, CAPILLARY
Glucose-Capillary: 129 mg/dL — ABNORMAL HIGH (ref 70–99)
Glucose-Capillary: 147 mg/dL — ABNORMAL HIGH (ref 70–99)
Glucose-Capillary: 162 mg/dL — ABNORMAL HIGH (ref 70–99)
Glucose-Capillary: 202 mg/dL — ABNORMAL HIGH (ref 70–99)

## 2014-07-28 MED ORDER — FLEET ENEMA 7-19 GM/118ML RE ENEM
1.0000 | ENEMA | Freq: Every day | RECTAL | Status: DC | PRN
  Filled 2014-07-28: qty 1

## 2014-07-28 MED ORDER — METOPROLOL SUCCINATE ER 50 MG PO TB24
50.0000 mg | ORAL_TABLET | Freq: Every day | ORAL | Status: DC
  Administered 2014-07-28 – 2014-07-30 (×3): 50 mg via ORAL
  Filled 2014-07-28 (×3): qty 1

## 2014-07-28 NOTE — Progress Notes (Addendum)
[facility] PROGRESS NOTE  Dalton Lane ZOX:[id number] DOB: [birth year] DOA: 07/24/2014 PCP: Alva GarnetSHELTON,KIMBERLY R., MD  Summary 80 year old male with bullous pemphigoid presented with bilateral lower extremity cellulitis overlying his chronic bullous lesions. Blood pressure in the emergency room as low as 80/58.  Assessment/Plan:    Sepsis secondary to bilateral lower extremity cellulitis:  -given 1 dose of hydrocortisone  -BP better -resume metoprolol.  -vancomycin and cefepime.    Chronic kidney disease: Last creatinine on record was in 2011 and range between 2.1-2.4. ? new baseline versus acute renal failure. Will monitor.   Cellulitis bilateral feet and lower extremities: See above.   DM2 (diabetes mellitus, type 2):  Controlled.   Hyperkalemia:  Resolved   Bullous pemphigoid:  [facility] Dr. Wynelle ClevelandJoseph Jorizzo. Telephone number [phone] or [phone]. Will hold methotrexate in the setting of acute infection. Continue prednisone therapy.    Code Status:  full Family Communication:  Called wife 12/11 Disposition Plan:  SNF  Consultants:    Procedures:     Antibiotics:  Vancomycin cefepime 12/8 --  HPI/Subjective: Anxious to get to rehab  Objective: Filed Vitals:   07/28/14 0539  BP: 151/73  Pulse: 90  Temp: 98.2 F (36.8 C)  Resp: 20    Intake/Output Summary (Last 24 hours) at 07/28/14 1217 Last data filed at 07/28/14 [id number]  Gross per 24 hour  Intake    600 ml  Output   1630 ml  Net  -1030 ml   Filed Weights   07/24/14 2255  Weight: 104.2 kg (229 lb 11.5 oz)    Exam:   General:  Elderly white male- NAD  HEENT: Hard of hearing  Cardiovascular: Regular rate rhythm without murmurs gallops rubs  Respiratory: Clear to auscultation bilaterally without wheezes rhonchi or rales  Abdomen: Soft nontender nondistended  Ext: Multiple bullous lesions on his distal legs and feet some with underlying blood. Erythema,  warmth, tenderness, boggy induration involving the feet and distal lower extremities  Basic Metabolic Panel:  Recent Labs Lab 07/24/14 1826 07/25/14 0220 07/26/14 0340 07/28/14 0509  NA 135* 139 136* 143  K 5.5* 4.9 5.1 5.3  CL 95* 103 103 109  CO2 19 19 17* 20  GLUCOSE 295* 147* 148* 103*  BUN 109* 98* 99* 70*  CREATININE 3.19* 3.00* 2.90* 2.21*  CALCIUM 9.2 8.7 8.9 9.4   Liver Function Tests:  Recent Labs Lab 07/24/14 1826  AST 11  ALT 21  ALKPHOS 70  BILITOT 0.4  PROT 6.5  ALBUMIN 3.1*   No results for input(s): LIPASE, AMYLASE in the last 168 hours. No results for input(s): AMMONIA in the last 168 hours. CBC:  Recent Labs Lab 07/24/14 1826 07/25/14 0220 07/26/14 0340 07/28/14 0509  WBC 10.4 8.0 7.1 6.1  NEUTROABS 8.5*  --   --   --   HGB 11.4* 10.1* 9.8* 9.9*  HCT 35.2* 31.2* 30.0* 30.8*  MCV 95.1 94.8 94.9 99.7  PLT 139* 123* 106* 104*   Cardiac Enzymes: No results for input(s): CKTOTAL, CKMB, CKMBINDEX, TROPONINI in the last 168 hours. BNP (last 3 results) No results for input(s): PROBNP in the last 8760 hours. CBG:  Recent Labs Lab 07/27/14 0755 07/27/14 1201 07/27/14 1658 07/27/14 2135 07/28/14 0827  GLUCAP 235* 134* 173* 188* 147*    Recent Results (from the past 240 hour(s))  Blood culture (routine x 2)     Status: None (Preliminary result)   Collection Time: 07/24/14  8:16 PM  Result  Value Ref Range Status   Specimen Description BLOOD LEFT ARM  Final   Special Requests BOTTLES DRAWN AEROBIC AND ANAEROBIC 5CC  Final   Culture  Setup Time   Final    07/25/2014 00:57 Performed at [facility]    Culture   Final           BLOOD CULTURE RECEIVED NO GROWTH TO DATE CULTURE WILL BE HELD FOR 5 DAYS BEFORE ISSUING A FINAL NEGATIVE REPORT Performed at [facility]    Report Status PENDING  Incomplete  Blood culture (routine x 2)     Status: None (Preliminary result)   Collection Time: 07/24/14  8:18 PM  Result Value Ref  Range Status   Specimen Description BLOOD RIGHT HAND  Final   Special Requests BOTTLES DRAWN AEROBIC ONLY 2CC  Final   Culture  Setup Time   Final    07/25/2014 00:57 Performed at [facility]    Culture   Final           BLOOD CULTURE RECEIVED NO GROWTH TO DATE CULTURE WILL BE HELD FOR 5 DAYS BEFORE ISSUING A FINAL NEGATIVE REPORT Performed at [facility]    Report Status PENDING  Incomplete  Urine culture     Status: None   Collection Time: 07/24/14 10:57 PM  Result Value Ref Range Status   Specimen Description URINE, CLEAN CATCH  Final   Special Requests NONE  Final   Culture  Setup Time   Final    07/25/2014 04:13 Performed at [facility] Count   Final    >=100,000 COLONIES/ML Performed at [facility]    Culture   Final    Multiple bacterial morphotypes present, none predominant. Suggest appropriate recollection if clinically indicated. Performed at [facility]    Report Status 07/26/2014 FINAL  Final  MRSA PCR Screening     Status: None   Collection Time: 07/24/14 10:58 PM  Result Value Ref Range Status   MRSA by PCR NEGATIVE NEGATIVE Final    Comment:        The GeneXpert MRSA Assay (FDA approved for NASAL specimens only), is one component of a comprehensive MRSA colonization surveillance program. It is not intended to diagnose MRSA infection nor to guide or monitor treatment for MRSA infections.      Studies: No results found.  Scheduled Meds: . aspirin EC  325 mg Oral Daily  . ceFEPime (MAXIPIME) IV  1 g Intravenous Q24H  . famotidine  20 mg Oral Daily  . folic acid  1 mg Oral q morning - 10a  . heparin  5,000 Units Subcutaneous 3 times per day  . insulin aspart  0-15 Units Subcutaneous TID WC  . insulin glargine  24 Units Subcutaneous QHS  . polyethylene glycol  17 g Oral Daily  . pravastatin  80 mg Oral Daily  . predniSONE  20 mg Oral Daily  . predniSONE  5 mg Oral Q supper  . roflumilast   500 mcg Oral Daily  . senna-docusate  1 tablet Oral BID  . silver sulfADIAZINE   Topical Daily  . sodium bicarbonate  325 mg Oral Daily  . sodium chloride  3 mL Intravenous Q12H  . vancomycin  1,500 mg Intravenous Q48H   Continuous Infusions: . sodium chloride 10 mL/hr at 07/26/14 2152    Time spent: 25 minutes  Marlin Canary  [facility] Pager [phone]. If 7PM-7AM, please contact night-coverage at  [email or web address], password [email or web address] 07/28/2014, 12:17 PM  LOS: 4 days

## 2014-07-29 LAB — GLUCOSE, CAPILLARY
Glucose-Capillary: 74 mg/dL (ref 70–99)
Glucose-Capillary: 100 mg/dL — ABNORMAL HIGH (ref 70–99)
Glucose-Capillary: 198 mg/dL — ABNORMAL HIGH (ref 70–99)
Glucose-Capillary: 351 mg/dL — ABNORMAL HIGH (ref 70–99)

## 2014-07-29 NOTE — Progress Notes (Signed)
[facility] PROGRESS NOTE  Dalton Lane WJX:[id number] DOB: 01-Jun-1930 DOA: 07/24/2014 PCP: Alva Garnet., MD  Summary 80 year old male with bullous pemphigoid presented with bilateral lower extremity cellulitis overlying his chronic bullous lesions. Blood pressure in the emergency room as low as 80/58.  Assessment/Plan: Sepsis secondary to bilateral lower extremity cellulitis:  -given 1 dose of hydrocortisone  -BP better -resume metoprolol.  -vancomycin and cefepime day #5- change to PO in AM    Chronic kidney disease: Last creatinine on record was in 2011 and range between 2.1-2.4. improved.   Cellulitis bilateral feet and lower extremities: See above.   DM2 (diabetes mellitus, type 2):  Controlled.   Hyperkalemia:  Resolved   Bullous pemphigoid:  Patient is followed at [facility] by Dr. Wynelle Cleveland. Telephone number [phone] or [phone]. Will hold methotrexate in the setting of acute infection. Continue prednisone therapy.    Code Status:  full Family Communication:  Called wife 12/11 Disposition Plan:  SNF  Consultants:    Procedures:     Antibiotics:  Vancomycin cefepime 12/8 --  HPI/Subjective: Anxious to get to rehab Had large BM yest- eating ebtter  Objective: Filed Vitals:   07/29/14 0555  BP: 132/59  Pulse:   Temp: 98.1 F (36.7 C)  Resp: 20    Intake/Output Summary (Last 24 hours) at 07/29/14 3244 Last data filed at 07/29/14 0020  Gross per 24 hour  Intake    363 ml  Output    400 ml  Net    -37 ml   Filed Weights   07/24/14 2255  Weight: 104.2 kg (229 lb 11.5 oz)    Exam:   General:  Elderly white male- NAD  HEENT: Hard of hearing  Cardiovascular: Regular rate rhythm without murmurs gallops rubs  Respiratory: Clear to auscultation bilaterally without wheezes rhonchi or rales  Abdomen: Soft nontender nondistended  Ext: Multiple bullous lesions on his distal legs and feet some with underlying blood.  Erythema, warmth, tenderness, boggy induration involving the feet and distal lower extremities  Basic Metabolic Panel:  Recent Labs Lab 07/24/14 1826 07/25/14 0220 07/26/14 0340 07/28/14 0509  NA 135* 139 136* 143  K 5.5* 4.9 5.1 5.3  CL 95* 103 103 109  CO2 19 19 17* 20  GLUCOSE 295* 147* 148* 103*  BUN 109* 98* 99* 70*  CREATININE 3.19* 3.00* 2.90* 2.21*  CALCIUM 9.2 8.7 8.9 9.4   Liver Function Tests:  Recent Labs Lab 07/24/14 1826  AST 11  ALT 21  ALKPHOS 70  BILITOT 0.4  PROT 6.5  ALBUMIN 3.1*   No results for input(s): LIPASE, AMYLASE in the last 168 hours. No results for input(s): AMMONIA in the last 168 hours. CBC:  Recent Labs Lab 07/24/14 1826 07/25/14 0220 07/26/14 0340 07/28/14 0509  WBC 10.4 8.0 7.1 6.1  NEUTROABS 8.5*  --   --   --   HGB 11.4* 10.1* 9.8* 9.9*  HCT 35.2* 31.2* 30.0* 30.8*  MCV 95.1 94.8 94.9 99.7  PLT 139* 123* 106* 104*   Cardiac Enzymes: No results for input(s): CKTOTAL, CKMB, CKMBINDEX, TROPONINI in the last 168 hours. BNP (last 3 results) No results for input(s): PROBNP in the last 8760 hours. CBG:  Recent Labs Lab 07/27/14 2135 07/28/14 0827 07/28/14 1221 07/28/14 1742 07/28/14 2338  GLUCAP 188* 147* 129* 202* 162*    Recent Results (from the past 240 hour(s))  Blood culture (routine x 2)     Status: None (Preliminary result)   Collection Time: 07/24/14  8:16 PM  Result Value Ref Range Status   Specimen Description BLOOD LEFT ARM  Final   Special Requests BOTTLES DRAWN AEROBIC AND ANAEROBIC 5CC  Final   Culture  Setup Time   Final    07/25/2014 00:57 Performed at [facility]    Culture   Final           BLOOD CULTURE RECEIVED NO GROWTH TO DATE CULTURE WILL BE HELD FOR 5 DAYS BEFORE ISSUING A FINAL NEGATIVE REPORT Performed at [facility]    Report Status PENDING  Incomplete  Blood culture (routine x 2)     Status: None (Preliminary result)   Collection Time: 07/24/14  8:18 PM  Result  Value Ref Range Status   Specimen Description BLOOD RIGHT HAND  Final   Special Requests BOTTLES DRAWN AEROBIC ONLY 2CC  Final   Culture  Setup Time   Final    07/25/2014 00:57 Performed at [facility]    Culture   Final           BLOOD CULTURE RECEIVED NO GROWTH TO DATE CULTURE WILL BE HELD FOR 5 DAYS BEFORE ISSUING A FINAL NEGATIVE REPORT Performed at [facility]    Report Status PENDING  Incomplete  Urine culture     Status: None   Collection Time: 07/24/14 10:57 PM  Result Value Ref Range Status   Specimen Description URINE, CLEAN CATCH  Final   Special Requests NONE  Final   Culture  Setup Time   Final    07/25/2014 04:13 Performed at [facility]    Colony Count   Final    >=100,000 COLONIES/ML Performed at [facility]    Culture   Final    Multiple bacterial morphotypes present, none predominant. Suggest appropriate recollection if clinically indicated. Performed at [facility]    Report Status 07/26/2014 FINAL  Final  MRSA PCR Screening     Status: None   Collection Time: 07/24/14 10:58 PM  Result Value Ref Range Status   MRSA by PCR NEGATIVE NEGATIVE Final    Comment:        The GeneXpert MRSA Assay (FDA approved for NASAL specimens only), is one component of a comprehensive MRSA colonization surveillance program. It is not intended to diagnose MRSA infection nor to guide or monitor treatment for MRSA infections.      Studies: No results found.  Scheduled Meds: . aspirin EC  325 mg Oral Daily  . ceFEPime (MAXIPIME) IV  1 g Intravenous Q24H  . famotidine  20 mg Oral Daily  . folic acid  1 mg Oral q morning - 10a  . heparin  5,000 Units Subcutaneous 3 times per day  . insulin aspart  0-15 Units Subcutaneous TID WC  . insulin glargine  24 Units Subcutaneous QHS  . metoprolol succinate  50 mg Oral Daily  . polyethylene glycol  17 g Oral Daily  . pravastatin  80 mg Oral Daily  . predniSONE  20 mg Oral Daily   . predniSONE  5 mg Oral Q supper  . roflumilast  500 mcg Oral Daily  . senna-docusate  1 tablet Oral BID  . silver sulfADIAZINE   Topical Daily  . sodium bicarbonate  325 mg Oral Daily  . sodium chloride  3 mL Intravenous Q12H  . vancomycin  1,500 mg Intravenous Q48H   Continuous Infusions: . sodium chloride 10 mL/hr at 07/26/14 2152    Time spent: 25 minutes  Marlin CanaryVANN, Kesa Birky  [facility] Pager [phone]. If 7PM-7AM, please contact night-coverage at [email or web address], password [email or web address] 07/29/2014, 7:33 AM  LOS: 5 days

## 2014-07-29 NOTE — Clinical Social Work Note (Signed)
CSW continues to follow for d/c planning. CSW spoke with patient's wife on this date. Per wife, [facility] has [facility]. CSW contacted facility and made them aware. CSW to follow tomorrow.  Dayrin Stallone Patrick-[place], LCSWA Weekend Clinical Social Worker [phone]

## 2014-07-30 LAB — CBC
HEMATOCRIT: 32.9 % — AB (ref 39.0–52.0)
Hemoglobin: 10.3 g/dL — ABNORMAL LOW (ref 13.0–17.0)
MCH: 30.8 pg (ref 26.0–34.0)
MCHC: 31.3 g/dL (ref 30.0–36.0)
MCV: 98.5 fL (ref 78.0–100.0)
PLATELETS: 140 10*3/uL — AB (ref 150–400)
RBC: 3.34 MIL/uL — AB (ref 4.22–5.81)
RDW: 17.5 % — ABNORMAL HIGH (ref 11.5–15.5)
WBC: 8.5 10*3/uL (ref 4.0–10.5)

## 2014-07-30 LAB — BASIC METABOLIC PANEL
ANION GAP: 16 — AB (ref 5–15)
BUN: 56 mg/dL — AB (ref 6–23)
CO2: 22 meq/L (ref 19–32)
CREATININE: 2.42 mg/dL — AB (ref 0.50–1.35)
Calcium: 9.4 mg/dL (ref 8.4–10.5)
Chloride: 107 mEq/L (ref 96–112)
GFR calc Af Amer: 27 mL/min — ABNORMAL LOW (ref >90)
GFR calc non Af Amer: 23 mL/min — ABNORMAL LOW (ref >90)
Glucose, Bld: 100 mg/dL — ABNORMAL HIGH (ref 70–99)
Potassium: 5.3 mEq/L (ref 3.7–5.3)
Sodium: 145 mEq/L (ref 137–147)

## 2014-07-30 LAB — GLUCOSE, CAPILLARY: GLUCOSE-CAPILLARY: 101 mg/dL — AB (ref 70–99)

## 2014-07-30 MED ORDER — BISACODYL 10 MG RE SUPP: 10.0000 mg | Freq: Every day | RECTAL | Status: DC | PRN

## 2014-07-30 MED ORDER — SILVER SULFADIAZINE 1 % EX CREA: TOPICAL_CREAM | Freq: Every day | CUTANEOUS | Status: DC

## 2014-07-30 MED ORDER — INSULIN GLARGINE 100 UNIT/ML ~~LOC~~ SOLN: 24.0000 [IU] | Freq: Every day | SUBCUTANEOUS | Status: AC

## 2014-07-30 MED ORDER — INSULIN ASPART 100 UNIT/ML ~~LOC~~ SOLN
0.0000 [IU] | Freq: Three times a day (TID) | SUBCUTANEOUS | Status: AC
Start: 2014-07-30 — End: ?

## 2014-07-30 MED ORDER — ALBUTEROL SULFATE (2.5 MG/3ML) 0.083% IN NEBU: 2.5000 mg | INHALATION_SOLUTION | RESPIRATORY_TRACT | Status: AC | PRN

## 2014-07-30 MED ORDER — PREDNISONE 5 MG PO TABS: 5.0000 mg | ORAL_TABLET | Freq: Every day | ORAL | Status: AC

## 2014-07-30 MED ORDER — DOXYCYCLINE HYCLATE 100 MG PO TABS
100.0000 mg | ORAL_TABLET | Freq: Two times a day (BID) | ORAL | Status: DC
  Administered 2014-07-30: 100 mg via ORAL
  Filled 2014-07-30 (×2): qty 1

## 2014-07-30 MED ORDER — SENNOSIDES-DOCUSATE SODIUM 8.6-50 MG PO TABS: 1.0000 | ORAL_TABLET | Freq: Two times a day (BID) | ORAL | Status: AC

## 2014-07-30 NOTE — Clinical Social Work Placement (Signed)
Dysheka Bibbs, LCSW Social Worker Signed  Clinical Social Work Placement 07/26/2014 5:13 PM    Expand All Collapse All   Clinical Social Work Department CLINICAL SOCIAL WORK PLACEMENT NOTE 07/26/2014  Patient: Dalton HewsFARMER,[name] Account Number: [id number] Admit date: 07/24/2014  Clinical Social Worker: Gwynne EdingerYSHEKA BIBBS, LCSWA Date/time: 07/26/2014 05:12 PM  Clinical Social Work is seeking post-discharge placement for this patient at the following level of care: SKILLED NURSING (*CSW will update this form in Epic as items are completed)   07/26/2014 Patient/family provided with [facility] Department of Clinical Social Work's list of facilities offering this level of care within the geographic area requested by the patient (or if unable, by the patient's family).  07/26/2014 Patient/family informed of their freedom to choose among providers that offer the needed level of care, that participate in Medicare, Medicaid or managed care program needed by the patient, have an available bed and are willing to accept the patient.  07/26/2014 Patient/family informed of [facility]' ownership interest in [facility], as well as of the fact that they are under no obligation to receive care at this facility.  PASARR submitted to EDS on 04/28/2010 PASARR number received on 04/29/2010  FL2 transmitted to all facilities in geographic area requested by pt/family on 07/26/2014 FL2 transmitted to all facilities within larger geographic area on 07/26/2014  Patient informed that his/her managed care company has contracts with or will negotiate with certain facilities, including the following:    Patient/family informed of bed offers received: 07/29/14 [facility] Physician recommends and patient chooses bed at   [facility] on 07/30/14 Patient to be transferred to facility by Ambulance Patient and family notified of  transfer on 07/30/14 Name of family member notified: Chyrl CivatteJoann at bedside  The following physician request were entered in Epic:   Additional Comments: Per MD patient ready [facility]. RN, patient, patient's family, and facility notified of DC. RN given number for report. DC packet on chart. [facility] transport requested for patient (service request ID: [id number]). CSW signing off.    Roddie McBryant Chessie Neuharth MSW, [place], [place], [phone]

## 2014-07-30 NOTE — Progress Notes (Signed)
Medicare Important Message given?  YES (If response is "NO", the following Medicare IM given date fields will be blank) Date Medicare IM given:  07/30/14 Medicare IM given by:  Sufian Ravi 

## 2014-07-30 NOTE — Discharge Summary (Signed)
Physician Discharge Summary  Dalton HewsWilliam C Lane WUJ:[id number] DOB: [birth year] DOA: 07/24/2014  PCP: Alva GarnetSHELTON,KIMBERLY R., MD  Admit date: 07/24/2014 Discharge date: 07/30/2014  Time spent: 35 minutes  Recommendations for Outpatient Follow-up:  Wound care: Silvadene for the LE wounds daily, cover with non adherent dressings. Paint the toe tips and dorsal foot wounds with betadine daily to keep dry. Prevalon boots to offload the heels and legs as he is such high risk for further breakdown.  Cbc, bmp 1 week Doxycycline for 4 more days Resume methotrexate once healed  Discharge Diagnoses:  Principal Problem:   Sepsis Active Problems:   Cellulitis of left foot   Cellulitis of right foot   DM type 2, uncontrolled, with renal complications   Hyperkalemia   Bullous pemphigoid   Chronic kidney disease (CKD), stage IV (severe)   COPD (chronic obstructive pulmonary disease)   Discharge Condition: improved  Diet recommendation: cardiac/diabetic  Filed Weights   07/24/14 2255  Weight: 104.2 kg (229 lb 11.5 oz)    History of present illness:  Dalton Lane is a 80 y.o. male with history of bullous skin disease outbreaks (unclear if bullous pemphigoid or pemphigus vulgaris). [facility] with outbreak on BLE lower legs and feet which have become erythematous, edematous. Symptoms worsening and onset over the past week, LLE has gotten really bad his wife says especially over the past day. He has a h/o DM2 and does have chronic numbness of BLE but states he has feeling in B feet and isnt really in pain down there.  Hospital Course:  Sepsis secondary to bilateral lower extremity cellulitis:   -vancomycin and cefepime for 5 days then changed to PO abx   Chronic kidney disease: Last creatinine on record was in 2011 and range between 2.1-2.4. Improved- resuming lasix and will need to monitor Cr outpatient  Cellulitis bilateral feet and lower extremities: See  above.  DM2 (diabetes mellitus, type 2): Controlled.  Hyperkalemia: Resolved  Bullous pemphigoid: [facility] by Dr. Wynelle ClevelandJoseph Jorizzo. Telephone number [phone] or [phone]. Will hold methotrexate in the setting of acute infection- resume once healed. Continue prednisone therapy. -wound care  Procedures:    Consultations:    Discharge Exam: Filed Vitals:   07/30/14 0530  BP:   Pulse: 76  Temp:   Resp:     General: A+Ox3, NAD- anxious to get to rehab Cardiovascular: rrr Respiratory: decreased b/l  Discharge Instructions You were cared for by a hospitalist during your hospital stay. If you have any questions about your discharge medications or the care you received while you were in the hospital after you are discharged, you can call the unit and asked to speak with the hospitalist on call if the hospitalist that took care of you is not available. Once you are discharged, your primary care physician will handle any further medical issues. Please note that NO REFILLS for any discharge medications will be authorized once you are discharged, as it is imperative that you return to your primary care physician (or establish a relationship with a primary care physician if you do not have one) for your aftercare needs so that they can reassess your need for medications and monitor your lab values.  Discharge Instructions    Diet - low sodium heart healthy    Complete by:  As directed      Diet Carb Modified    Complete by:  As directed      Discharge instructions  Complete by:  As directed   Wound care: Silvadene for the LE wounds daily, cover with non adherent dressings. Paint the toe tips and dorsal foot wounds with betadine daily to keep dry. Prevalon boots to offload the heels and legs as he is such high risk for further breakdown.  Cbc, BMP 1 week Monitor blood sugars     Increase activity slowly    Complete by:  As directed            Current Discharge Medication List    START taking these medications   Details  albuterol (PROVENTIL) (2.5 MG/3ML) 0.083% nebulizer solution Take 3 mLs (2.5 mg total) by nebulization every 2 (two) hours as needed for wheezing or shortness of breath. Qty: 75 mL, Refills: 12    bisacodyl (DULCOLAX) 10 MG suppository Place 1 suppository (10 mg total) rectally daily as needed for moderate constipation. Qty: 12 suppository, Refills: 0    insulin aspart (NOVOLOG) 100 UNIT/ML injection Inject 0-15 Units into the skin 3 (three) times daily with meals. Qty: 10 mL, Refills: 11    senna-docusate (SENOKOT-S) 8.6-50 MG per tablet Take 1 tablet by mouth 2 (two) times daily.    silver sulfADIAZINE (SILVADENE) 1 % cream Apply topically daily. Qty: 50 g, Refills: 0      CONTINUE these medications which have CHANGED   Details  insulin glargine (LANTUS) 100 UNIT/ML injection Inject 0.24 mLs (24 Units total) into the skin at bedtime. Qty: 10 mL, Refills: 11    !! predniSONE (DELTASONE) 5 MG tablet Take 1 tablet (5 mg total) by mouth daily with supper.     !! - Potential duplicate medications found. Please discuss with provider.    CONTINUE these medications which have NOT CHANGED   Details  acetaminophen (TYLENOL) 325 MG tablet Take 650 mg by mouth every 6 (six) hours as needed for mild pain.    aspirin 325 MG tablet Take 325 mg by mouth every morning.    Cholecalciferol (VITAMIN D3) 2000 UNITS TABS Take 2,000 Units by mouth daily.    doxycycline (VIBRA-TABS) 100 MG tablet Take 100 mg by mouth 2 (two) times daily. For 10 days, started Monday night    folic acid (FOLVITE) 1 MG tablet Take 1 mg by mouth every morning.    furosemide (LASIX) 40 MG tablet Take 80 mg by mouth daily.     HYDROcodone-acetaminophen (NORCO) 7.5-325 MG per tablet Take 1 tablet by mouth every 6 (six) hours as needed for moderate pain.    methotrexate (RHEUMATREX) 2.5 MG tablet Take 2.5 mg by mouth every Sunday.     metoprolol succinate (TOPROL-XL) 50 MG 24 hr tablet Take 50 mg by mouth every evening. Take with or immediately following a meal.    pravastatin (PRAVACHOL) 80 MG tablet Take 80 mg by mouth daily.    !! predniSONE (DELTASONE) 10 MG tablet Take 20 mg by mouth daily with breakfast.    ranitidine (ZANTAC) 150 MG tablet Take 150 mg by mouth daily as needed for heartburn.    roflumilast (DALIRESP) 500 MCG TABS tablet Take 500 mcg by mouth daily.    sodium bicarbonate 325 MG tablet Take 325 mg by mouth 2 (two) times daily.      !! - Potential duplicate medications found. Please discuss with provider.    STOP taking these medications     Insulin Glargine (TOUJEO SOLOSTAR) 300 UNIT/ML SOPN      Insulin Human POWD      losartan (COZAAR) 100  MG tablet      triamcinolone cream (KENALOG) 0.1 %      aspirin EC 325 MG tablet        Allergies  Allergen Reactions  . Amoxicillin Itching   Follow-up Information    Follow up with Alva Garnet., MD In 1 week.   Specialty:  Internal Medicine   Contact information:   [address] [place] [address] [phone]       Please follow up.   Why:  followed at [facility] by Dr. Wynelle Cleveland for bullous phemphgiod        The results of significant diagnostics from this hospitalization (including imaging, microbiology, ancillary and laboratory) are listed below for reference.    Significant Diagnostic Studies: Dg Chest 2 View  07/24/2014   CLINICAL DATA:  COPD, diabetes  EXAM: CHEST  2 VIEW  COMPARISON:  04/26/2010  FINDINGS: Persistent lingular scarring. Left lower lobe opacity which is increased compared with CT chest dated 04/25/2010. There is no pleural effusion or pneumothorax. Stable cardiomediastinal silhouette. Unremarkable osseous structures.  IMPRESSION: Left lower lobe airspace opacity which is increased compared with prior exams which may reflect scarring with concomitant atelectasis or pneumonia.    Electronically Signed   By: Elige Ko   On: 07/24/2014 22:13   Dg Foot 2 Views Left  07/24/2014   CLINICAL DATA:  Swelling and redness in bilateral feet. Open sores of the posterior distal tibia.  EXAM: LEFT FOOT - 2 VIEW  COMPARISON:  None.  FINDINGS: There is no evidence of fracture or dislocation. There is no evidence of arthropathy or other focal bone abnormality. There is soft tissue swelling over the dorsal midfoot. There is peripheral vascular atherosclerotic disease.  IMPRESSION: 1. No radiographic evidence of osteomyelitis of the left foot. Soft tissue swelling over the dorsal aspect of the left midfoot and plantar aspect of the left forefoot which may reflect cellulitis.   Electronically Signed   By: Elige Ko   On: 07/24/2014 22:09   Dg Foot 2 Views Right  07/24/2014   CLINICAL DATA:  Swelling and redness of bilateral feet  EXAM: RIGHT FOOT - 2 VIEW  COMPARISON:  None.  FINDINGS: There is no evidence of fracture or dislocation. There is no evidence of arthropathy or other focal bone abnormality. There is peripheral vascular atherosclerotic disease. Soft tissue swelling over the dorsal midfoot.  IMPRESSION: No radiographic evidence of osteomyelitis of the right foot. Soft tissue swelling over the dorsal midfoot.   Electronically Signed   By: Elige Ko   On: 07/24/2014 22:10    Microbiology: Recent Results (from the past 240 hour(s))  Blood culture (routine x 2)     Status: None (Preliminary result)   Collection Time: 07/24/14  8:16 PM  Result Value Ref Range Status   Specimen Description BLOOD LEFT ARM  Final   Special Requests BOTTLES DRAWN AEROBIC AND ANAEROBIC 5CC  Final   Culture  Setup Time   Final    07/25/2014 00:57 Performed at [facility]    Culture   Final           BLOOD CULTURE RECEIVED NO GROWTH TO DATE CULTURE WILL BE HELD FOR 5 DAYS BEFORE ISSUING A FINAL NEGATIVE REPORT Performed at [facility]    Report Status PENDING  Incomplete  Blood  culture (routine x 2)     Status: None (Preliminary result)   Collection Time: 07/24/14  8:18 PM  Result Value Ref Range  Status   Specimen Description BLOOD RIGHT HAND  Final   Special Requests BOTTLES DRAWN AEROBIC ONLY 2CC  Final   Culture  Setup Time   Final    07/25/2014 00:57 Performed at [facility]    Culture   Final           BLOOD CULTURE RECEIVED NO GROWTH TO DATE CULTURE WILL BE HELD FOR 5 DAYS BEFORE ISSUING A FINAL NEGATIVE REPORT Performed at [facility]    Report Status PENDING  Incomplete  Urine culture     Status: None   Collection Time: 07/24/14 10:57 PM  Result Value Ref Range Status   Specimen Description URINE, CLEAN CATCH  Final   Special Requests NONE  Final   Culture  Setup Time   Final    07/25/2014 04:13 Performed at [facility]    Colony Count   Final    >=100,000 COLONIES/ML Performed at [facility]    Culture   Final    Multiple bacterial morphotypes present, none predominant. Suggest appropriate recollection if clinically indicated. Performed at [facility]    Report Status 07/26/2014 FINAL  Final  MRSA PCR Screening     Status: None   Collection Time: 07/24/14 10:58 PM  Result Value Ref Range Status   MRSA by PCR NEGATIVE NEGATIVE Final    Comment:        The GeneXpert MRSA Assay (FDA approved for NASAL specimens only), is one component of a comprehensive MRSA colonization surveillance program. It is not intended to diagnose MRSA infection nor to guide or monitor treatment for MRSA infections.      Labs: Basic Metabolic Panel:  Recent Labs Lab 07/24/14 1826 07/25/14 0220 07/26/14 0340 07/28/14 0509 07/30/14 0645  NA 135* 139 136* 143 145  K 5.5* 4.9 5.1 5.3 5.3  CL 95* 103 103 109 107  CO2 19 19 17* 20 22  GLUCOSE 295* 147* 148* 103* 100*  BUN 109* 98* 99* 70* 56*  CREATININE 3.19* 3.00* 2.90* 2.21* 2.42*  CALCIUM 9.2 8.7 8.9 9.4 9.4   Liver Function Tests:  Recent  Labs Lab 07/24/14 1826  AST 11  ALT 21  ALKPHOS 70  BILITOT 0.4  PROT 6.5  ALBUMIN 3.1*   No results for input(s): LIPASE, AMYLASE in the last 168 hours. No results for input(s): AMMONIA in the last 168 hours. CBC:  Recent Labs Lab 07/24/14 1826 07/25/14 0220 07/26/14 0340 07/28/14 0509 07/30/14 0645  WBC 10.4 8.0 7.1 6.1 8.5  NEUTROABS 8.5*  --   --   --   --   HGB 11.4* 10.1* 9.8* 9.9* 10.3*  HCT 35.2* 31.2* 30.0* 30.8* 32.9*  MCV 95.1 94.8 94.9 99.7 98.5  PLT 139* 123* 106* 104* 140*   Cardiac Enzymes: No results for input(s): CKTOTAL, CKMB, CKMBINDEX, TROPONINI in the last 168 hours. BNP: BNP (last 3 results) No results for input(s): PROBNP in the last 8760 hours. CBG:  Recent Labs Lab 07/29/14 0822 07/29/14 1225 07/29/14 1804 07/29/14 2147 07/30/14 0749  GLUCAP 74 100* 198* 351* 101*       Signed:  Ineta Sinning  [facility] 07/30/2014, 8:26 AM

## 2014-07-31 ENCOUNTER — Other Ambulatory Visit: Payer: Self-pay | Admitting: *Deleted

## 2014-07-31 LAB — CULTURE, BLOOD (ROUTINE X 2)
CULTURE: NO GROWTH
Culture: NO GROWTH

## 2014-07-31 MED ORDER — HYDROCODONE-ACETAMINOPHEN 7.5-325 MG PO TABS: 1.0000 | ORAL_TABLET | Freq: Four times a day (QID) | ORAL | Status: AC | PRN

## 2014-07-31 NOTE — Telephone Encounter (Signed)
[facility] 

## 2014-08-01 ENCOUNTER — Non-Acute Institutional Stay (SKILLED_NURSING_FACILITY): Payer: Medicare Other | Admitting: Internal Medicine

## 2014-08-01 DIAGNOSIS — E1129 Type 2 diabetes mellitus with other diabetic kidney complication: Secondary | ICD-10-CM

## 2014-08-01 DIAGNOSIS — N189 Chronic kidney disease, unspecified: Secondary | ICD-10-CM

## 2014-08-01 DIAGNOSIS — A419 Sepsis, unspecified organism: Secondary | ICD-10-CM

## 2014-08-01 DIAGNOSIS — N179 Acute kidney failure, unspecified: Secondary | ICD-10-CM

## 2014-08-01 DIAGNOSIS — L03116 Cellulitis of left lower limb: Secondary | ICD-10-CM

## 2014-08-01 DIAGNOSIS — J449 Chronic obstructive pulmonary disease, unspecified: Secondary | ICD-10-CM

## 2014-08-01 DIAGNOSIS — E1165 Type 2 diabetes mellitus with hyperglycemia: Secondary | ICD-10-CM

## 2014-08-01 DIAGNOSIS — IMO0002 Reserved for concepts with insufficient information to code with codable children: Secondary | ICD-10-CM

## 2014-08-01 DIAGNOSIS — L120 Bullous pemphigoid: Secondary | ICD-10-CM

## 2014-08-01 DIAGNOSIS — L03115 Cellulitis of right lower limb: Secondary | ICD-10-CM

## 2014-08-01 NOTE — Progress Notes (Signed)
MRN: [id number] Name: Dalton Lane  Sex: male Age: 80 y.o. DOB: 1930-03-22  [facility] Facility/Room: 307 Level Of Care: SNF Provider: Merrilee SeashoreALEXANDER, Roschelle Calandra D Emergency Contacts: Extended Emergency Contact Information Primary Emergency Contact: Clock,[name] Address: [address]          [place] [id number] [place] Home Phone: [phone] Relation: Spouse Secondary Emergency Contact: Rande LawmanFarmer,Pearl  [place] Home Phone: [phone] Relation: Sister   Allergies: Amoxicillin  Chief Complaint  Patient presents with  . New Admit To SNF    HPI: Patient is 80 y.o. male who was treated for sepsis from BLE cellulitis,now admitted to SNF for OT/PT for general weakness.  Past Medical History  Diagnosis Date  . COPD (chronic obstructive pulmonary disease)   . Kidney disease   . Diabetes mellitus without complication   . Skin disease, bullous     History reviewed. No pertinent past surgical history.    Medication List       This list is accurate as of: 08/01/14 11:59 PM.  Always use your most recent med list.               acetaminophen 325 MG tablet  Commonly known as:  TYLENOL  Take 650 mg by mouth every 6 (six) hours as needed for mild pain.     albuterol (2.5 MG/3ML) 0.083% nebulizer solution  Commonly known as:  PROVENTIL  Take 3 mLs (2.5 mg total) by nebulization every 2 (two) hours as needed for wheezing or shortness of breath.     aspirin 325 MG tablet  Take 325 mg by mouth every morning.     bisacodyl 10 MG suppository  Commonly known as:  DULCOLAX  Place 1 suppository (10 mg total) rectally daily as needed for moderate constipation.     doxycycline 100 MG tablet  Commonly known as:  VIBRA-TABS  Take 100 mg by mouth 2 (two) times daily. For 10 days, started Monday night     folic acid 1 MG tablet  Commonly known as:  FOLVITE  Take 1 mg by mouth every morning.     furosemide 40 MG tablet  Commonly known as:  LASIX   Take 80 mg by mouth daily.     HYDROcodone-acetaminophen 7.5-325 MG per tablet  Commonly known as:  NORCO  Take 1 tablet by mouth every 6 (six) hours as needed for moderate pain.     insulin aspart 100 UNIT/ML injection  Commonly known as:  novoLOG  Inject 0-15 Units into the skin 3 (three) times daily with meals.     insulin glargine 100 UNIT/ML injection  Commonly known as:  LANTUS  Inject 0.24 mLs (24 Units total) into the skin at bedtime.     methotrexate 2.5 MG tablet  Commonly known as:  RHEUMATREX  Take 2.5 mg by mouth every Sunday.     metoprolol succinate 50 MG 24 hr tablet  Commonly known as:  TOPROL-XL  Take 50 mg by mouth every evening. Take with or immediately following a meal.     pravastatin 80 MG tablet  Commonly known as:  PRAVACHOL  Take 80 mg by mouth daily.     predniSONE 10 MG tablet  Commonly known as:  DELTASONE  Take 20 mg by mouth daily with breakfast.     predniSONE 5 MG tablet  Commonly known as:  DELTASONE  Take 1 tablet (5 mg total) by mouth daily with supper.     ranitidine 150  MG tablet  Commonly known as:  ZANTAC  Take 150 mg by mouth daily as needed for heartburn.     roflumilast 500 MCG Tabs tablet  Commonly known as:  DALIRESP  Take 500 mcg by mouth daily.     senna-docusate 8.6-50 MG per tablet  Commonly known as:  Senokot-S  Take 1 tablet by mouth 2 (two) times daily.     silver sulfADIAZINE 1 % cream  Commonly known as:  SILVADENE  Apply topically daily.     sodium bicarbonate 325 MG tablet  Take 325 mg by mouth 2 (two) times daily.     Vitamin D3 2000 UNITS Tabs  Take 2,000 Units by mouth daily.        No orders of the defined types were placed in this encounter.    Immunization History  Administered Date(s) Administered  . Influenza-Unspecified 05/17/2013    History  Substance Use Topics  . Smoking status: Current Every Day Smoker -- 0.50 packs/day for 59 years    Types: Cigarettes  . Smokeless tobacco:  Not on file  . Alcohol Use: No    Family history is noncontributory    Review of Systems  DATA OBTAINED: from patient, nurse, medical record GENERAL:  no fevers, fatigue, appetite changes SKIN: No itching, rash or wounds EYES: No eye pain, redness, discharge EARS: No earache, tinnitus, change in hearing NOSE: No congestion, drainage or bleeding  MOUTH/THROAT: No mouth or tooth pain, No sore throat RESPIRATORY: No cough, wheezing, SOB CARDIAC: No chest pain, palpitations, lower extremity edema  GI: No abdominal pain, No N/V/D or constipation, No heartburn or reflux  GU: No dysuria, frequency or urgency, or incontinence  MUSCULOSKELETAL: No unrelieved bone/joint pain NEUROLOGIC: No headache, dizziness or focal weakness PSYCHIATRIC: No overt anxiety or sadness, No behavior issue.   Filed Vitals:   08/02/14 1833  BP: 156/88  Pulse: 89  Temp: 98.5 F (36.9 C)  Resp: 20    Physical Exam  GENERAL APPEARANCE: Alert, conversant,  No acute distress.  SKIN: BLE skin is covered with dressings;minimal drainage no heat to palpation HEAD: Normocephalic, atraumatic  EYES: Conjunctiva/lids clear. Pupils round, reactive. EOMs intact.  EARS: External exam WNL, canals clear. Hearing grossly normal.  NOSE: No deformity or discharge.  MOUTH/THROAT: Lips w/o lesions  RESPIRATORY: Breathing is even, unlabored. Lung sounds are diffusely decreased, no wheeze CARDIOVASCULAR: Heart irreg no murmurs, rubs or gallops. trace peripheral edema.   GASTROINTESTINAL: Abdomen is soft, non-tender, not distended w/ normal bowel sounds. GENITOURINARY: Bladder non tender, not distended  MUSCULOSKELETAL: No abnormal joints or musculature NEUROLOGIC:  Cranial nerves 2-12 grossly intact. Moves all extremities  PSYCHIATRIC: Mood and affect appropriate to situation, no behavioral issues  Patient Active Problem List   Diagnosis Date Noted  . Chronic kidney disease (CKD), stage IV (severe) 07/25/2014  . COPD  (chronic obstructive pulmonary disease) 07/25/2014  . Sepsis 07/24/2014  . Acute on chronic kidney failure 07/24/2014  . Cellulitis of left foot 07/24/2014  . Cellulitis of right foot 07/24/2014  . Nausea and vomiting 07/24/2014  . DM type 2, uncontrolled, with renal complications 07/24/2014  . Hyperkalemia 07/24/2014  . Bullous pemphigoid 07/24/2014    CBC    Component Value Date/Time   WBC 8.5 07/30/2014 0645   RBC 3.34* 07/30/2014 0645   HGB 10.3* 07/30/2014 0645   HCT 32.9* 07/30/2014 0645   PLT 140* 07/30/2014 0645   MCV 98.5 07/30/2014 0645   LYMPHSABS 1.5 07/24/2014 1826   MONOABS  0.4 07/24/2014 1826   EOSABS 0.0 07/24/2014 1826   BASOSABS 0.0 07/24/2014 1826    CMP     Component Value Date/Time   NA 145 07/30/2014 0645   K 5.3 07/30/2014 0645   CL 107 07/30/2014 0645   CO2 22 07/30/2014 0645   GLUCOSE 100* 07/30/2014 0645   BUN 56* 07/30/2014 0645   CREATININE 2.42* 07/30/2014 0645   CALCIUM 9.4 07/30/2014 0645   PROT 6.5 07/24/2014 1826   ALBUMIN 3.1* 07/24/2014 1826   AST 11 07/24/2014 1826   ALT 21 07/24/2014 1826   ALKPHOS 70 07/24/2014 1826   BILITOT 0.4 07/24/2014 1826   GFRNONAA 23* 07/30/2014 0645   GFRAA 27* 07/30/2014 0645    Assessment and Plan  Sepsis secondary to bilateral lower extremity cellulitis:   -vancomycin and cefepime for 5 days then changed to PO abx  Acute on chronic kidney failure Last creatinine on record was in 2011 and range between 2.1-2.4. Improved- resuming lasix and will need to monitor Cr outpatient  Bullous pemphigoid Patient [facility] by Dr. Wynelle ClevelandJoseph Jorizzo. Telephone number [phone] or [phone]. Will hold methotrexate in the setting of acute infection- resume once healed. Continue prednisone therapy  Cellulitis of right foot Wound care: Silvadene for the LE wounds daily, cover with non adherent dressings. Paint the toe tips and dorsal foot wounds with betadine daily to keep dry. Prevalon  boots to offload the heels and legs as he is such high risk for further breakdown  Cellulitis of left foot Wound care: Silvadene for the LE wounds daily, cover with non adherent dressings. Paint the toe tips and dorsal foot wounds with betadine daily to keep dry. Prevalon boots to offload the heels and legs as he is such high risk for further breakdown  DM type 2, uncontrolled, with renal complications BS controlled  COPD (chronic obstructive pulmonary disease) Stable and chronic;continue daliresp and prn neb    Margit HanksALEXANDER, Xenia Nile D, MD

## 2014-08-02 ENCOUNTER — Encounter: Payer: Self-pay | Admitting: Internal Medicine

## 2014-08-02 NOTE — Assessment & Plan Note (Signed)
[facility] by Dr. Wynelle ClevelandJoseph Jorizzo. Telephone number [phone] or [phone]. Will hold methotrexate in the setting of acute infection- resume once healed. Continue prednisone therapy

## 2014-08-02 NOTE — Assessment & Plan Note (Signed)
BS controlled

## 2014-08-02 NOTE — Assessment & Plan Note (Signed)
Wound care: Silvadene for the LE wounds daily, cover with non adherent dressings. Paint the toe tips and dorsal foot wounds with betadine daily to keep dry. Prevalon boots to offload the heels and legs as he is such high risk for further breakdown 

## 2014-08-02 NOTE — Assessment & Plan Note (Signed)
secondary to bilateral lower extremity cellulitis:   -vancomycin and cefepime for 5 days then changed to PO abx

## 2014-08-02 NOTE — Assessment & Plan Note (Signed)
Last creatinine on record was in 2011 and range between 2.1-2.4. Improved- resuming lasix and will need to monitor Cr outpatient

## 2014-08-02 NOTE — Assessment & Plan Note (Signed)
Wound care: Silvadene for the LE wounds daily, cover with non adherent dressings. Paint the toe tips and dorsal foot wounds with betadine daily to keep dry. Prevalon boots to offload the heels and legs as he is such high risk for further breakdown

## 2014-08-02 NOTE — Assessment & Plan Note (Signed)
Stable and chronic;continue daliresp and prn neb

## 2014-08-03 ENCOUNTER — Non-Acute Institutional Stay (SKILLED_NURSING_FACILITY): Payer: Medicare Other | Admitting: Nurse Practitioner

## 2014-08-03 DIAGNOSIS — L120 Bullous pemphigoid: Secondary | ICD-10-CM

## 2014-08-03 DIAGNOSIS — L03115 Cellulitis of right lower limb: Secondary | ICD-10-CM

## 2014-08-03 NOTE — Progress Notes (Signed)
Patient ID: Dalton Lane, male   DOB: 07-21-1930, 80 y.o.   MRN: [id number]    Nursing Home Location:  [facility]   Place of Service: SNF (31)  PCP: Alva GarnetSHELTON,KIMBERLY R., MD  Allergies  Allergen Reactions  . Amoxicillin Itching    Chief Complaint  Patient presents with  . Acute Visit    worsening of wounds    HPI:  Patient is a 80 y.o. male seen today at [facility] at the request of nursing. Treatment nurse reports wounds to bilateral LE are unchanged and possibly worse to right foot. No fevers or chills. No redness or drainage noted. Pt currently completing treatment for cellulitis and bullous pemphigoid.  Review of Systems:  Review of Systems  Constitutional: Negative for fever, diaphoresis, activity change, appetite change, fatigue and unexpected weight change.  Eyes: Negative.   Respiratory: Negative for cough and shortness of breath.   Cardiovascular: Positive for leg swelling. Negative for chest pain and palpitations.  Gastrointestinal: Negative for diarrhea and constipation.  Musculoskeletal: Negative for myalgias and arthralgias.  Skin: Positive for wound. Negative for color change and pallor.  Neurological: Positive for weakness. Negative for dizziness.  Psychiatric/Behavioral: Negative for behavioral problems, confusion and agitation.    Past Medical History  Diagnosis Date  . COPD (chronic obstructive pulmonary disease)   . Kidney disease   . Diabetes mellitus without complication   . Skin disease, bullous    No past surgical history on file. Social History:   reports that he has been smoking Cigarettes.  He has a 29.5 pack-year smoking history. He does not have any smokeless tobacco history on file. He reports that he does not drink alcohol or use illicit drugs.  No family history on file.  Medications: Patient's Medications  New Prescriptions   No medications on file  Previous Medications   ACETAMINOPHEN (TYLENOL)  325 MG TABLET    Take 650 mg by mouth every 6 (six) hours as needed for mild pain.   ALBUTEROL (PROVENTIL) (2.5 MG/3ML) 0.083% NEBULIZER SOLUTION    Take 3 mLs (2.5 mg total) by nebulization every 2 (two) hours as needed for wheezing or shortness of breath.   ASPIRIN 325 MG TABLET    Take 325 mg by mouth every morning.   BISACODYL (DULCOLAX) 10 MG SUPPOSITORY    Place 1 suppository (10 mg total) rectally daily as needed for moderate constipation.   CHOLECALCIFEROL (VITAMIN D3) 2000 UNITS TABS    Take 2,000 Units by mouth daily.   DOXYCYCLINE (VIBRA-TABS) 100 MG TABLET    Take 100 mg by mouth 2 (two) times daily. For 10 days, started Monday night   FOLIC ACID (FOLVITE) 1 MG TABLET    Take 1 mg by mouth every morning.   FUROSEMIDE (LASIX) 40 MG TABLET    Take 80 mg by mouth daily.    HYDROCODONE-ACETAMINOPHEN (NORCO) 7.5-325 MG PER TABLET    Take 1 tablet by mouth every 6 (six) hours as needed for moderate pain.   INSULIN ASPART (NOVOLOG) 100 UNIT/ML INJECTION    Inject 0-15 Units into the skin 3 (three) times daily with meals.   INSULIN GLARGINE (LANTUS) 100 UNIT/ML INJECTION    Inject 0.24 mLs (24 Units total) into the skin at bedtime.   METHOTREXATE (RHEUMATREX) 2.5 MG TABLET    Take 2.5 mg by mouth every Sunday.   METOPROLOL SUCCINATE (TOPROL-XL) 50 MG 24 HR TABLET    Take 50 mg by mouth every evening.  Take with or immediately following a meal.   PRAVASTATIN (PRAVACHOL) 80 MG TABLET    Take 80 mg by mouth daily.   PREDNISONE (DELTASONE) 10 MG TABLET    Take 20 mg by mouth daily with breakfast.   PREDNISONE (DELTASONE) 5 MG TABLET    Take 1 tablet (5 mg total) by mouth daily with supper.   RANITIDINE (ZANTAC) 150 MG TABLET    Take 150 mg by mouth daily as needed for heartburn.   ROFLUMILAST (DALIRESP) 500 MCG TABS TABLET    Take 500 mcg by mouth daily.   SENNA-DOCUSATE (SENOKOT-S) 8.6-50 MG PER TABLET    Take 1 tablet by mouth 2 (two) times daily.   SILVER SULFADIAZINE (SILVADENE) 1 % CREAM     Apply topically daily.   SODIUM BICARBONATE 325 MG TABLET    Take 325 mg by mouth 2 (two) times daily.   Modified Medications   No medications on file  Discontinued Medications   No medications on file     Physical Exam: Filed Vitals:   08/03/14 1715  BP: 133/82  Pulse: 84  Temp: 98.4 F (36.9 C)  Resp: 20    Physical Exam  Constitutional: He is oriented to person, place, and time. No distress.  HENT:  Mouth/Throat: Oropharynx is clear and moist. No oropharyngeal exudate.  Cardiovascular: Normal rate, regular rhythm and normal heart sounds.   Pulmonary/Chest: Effort normal and breath sounds normal.  Musculoskeletal: He exhibits no tenderness. Edema: +1 bilaterally   Neurological: He is alert and oriented to person, place, and time.  Skin: Skin is warm and dry. He is not diaphoretic.  Multiple wounds/ulcers on bilateral LE, blisters noted, faint pulses  Swelling unchanged No odor and drainage.     Labs reviewed: Basic Metabolic Panel:  Recent Labs  16/05/9611/10/15 0340 07/28/14 0509 07/30/14 0645  NA 136* 143 145  K 5.1 5.3 5.3  CL 103 109 107  CO2 17* 20 22  GLUCOSE 148* 103* 100*  BUN 99* 70* 56*  CREATININE 2.90* 2.21* 2.42*  CALCIUM 8.9 9.4 9.4   Liver Function Tests:  Recent Labs  07/24/14 1826  AST 11  ALT 21  ALKPHOS 70  BILITOT 0.4  PROT 6.5  ALBUMIN 3.1*   No results for input(s): LIPASE, AMYLASE in the last 8760 hours. No results for input(s): AMMONIA in the last 8760 hours. CBC:  Recent Labs  07/24/14 1826  07/26/14 0340 07/28/14 0509 07/30/14 0645  WBC 10.4  < > 7.1 6.1 8.5  NEUTROABS 8.5*  --   --   --   --   HGB 11.4*  < > 9.8* 9.9* 10.3*  HCT 35.2*  < > 30.0* 30.8* 32.9*  MCV 95.1  < > 94.9 99.7 98.5  PLT 139*  < > 106* 104* 140*  < > = values in this interval not displayed. TSH: No results for input(s): TSH in the last 8760 hours. A1C: Lab Results  Component Value Date   HGBA1C * 04/26/2010    6.7 (NOTE)                                                                        According to the ADA Clinical Practice Recommendations for 2011,  when HbA1c is used as a screening test:   >=6.5%   Diagnostic of Diabetes Mellitus           (if abnormal result  is confirmed)  5.7-6.4%   Increased risk of developing Diabetes Mellitus  References:Diagnosis and Classification of Diabetes Mellitus,Diabetes Care,2011,34(Suppl 1):S62-S69 and Standards of Medical Care in         Diabetes - 2011,Diabetes Care,2011,34  (Suppl 1):S11-S61.   Lipid Panel: No results for input(s): CHOL, HDL, LDLCALC, TRIG, CHOLHDL, LDLDIRECT in the last 8760 hours.  Assessment/Plan 1. Bullous pemphigoid Pt completing treatment for bullous pemphigoid but left with multiple wounds on bilateral lower extremities and has edema. Dr Lyn Hollingshead and myself assess wounds and discussed case. Wounds appear to be stable and without infection however slow to improve. Will get ABIs. Per treatment nurse he was once following with vascular but has not followed up with them in a while. Will have pt follow up with vein and vascular as soon as possible for evaluation and treatment.    2. Cellulitis improved

## 2014-08-16 ENCOUNTER — Encounter: Payer: Self-pay | Admitting: Internal Medicine

## 2014-08-16 ENCOUNTER — Non-Acute Institutional Stay (SKILLED_NURSING_FACILITY): Payer: Medicare Other | Admitting: Internal Medicine

## 2014-08-16 DIAGNOSIS — E1165 Type 2 diabetes mellitus with hyperglycemia: Secondary | ICD-10-CM

## 2014-08-16 DIAGNOSIS — L03115 Cellulitis of right lower limb: Secondary | ICD-10-CM

## 2014-08-16 DIAGNOSIS — A419 Sepsis, unspecified organism: Secondary | ICD-10-CM

## 2014-08-16 DIAGNOSIS — N184 Chronic kidney disease, stage 4 (severe): Secondary | ICD-10-CM

## 2014-08-16 DIAGNOSIS — IMO0002 Reserved for concepts with insufficient information to code with codable children: Secondary | ICD-10-CM

## 2014-08-16 DIAGNOSIS — L03116 Cellulitis of left lower limb: Secondary | ICD-10-CM

## 2014-08-16 DIAGNOSIS — L120 Bullous pemphigoid: Secondary | ICD-10-CM

## 2014-08-16 DIAGNOSIS — J449 Chronic obstructive pulmonary disease, unspecified: Secondary | ICD-10-CM

## 2014-08-16 DIAGNOSIS — E1129 Type 2 diabetes mellitus with other diabetic kidney complication: Secondary | ICD-10-CM

## 2014-08-16 NOTE — Progress Notes (Signed)
MRN: [id number] Name: Vincenza HewsWilliam C Sur  Sex: male Age: 80 y.o. DOB: December 06, 1929  [facility] Facility/Room 307: Level Of Care: SNF Provider: Merrilee SeashoreALEXANDER, Nala Kachel D Emergency Contacts: Extended Emergency Contact Information Primary Emergency Contact: Mcminn,[name] Address: [address]          [place] [phone] [place] Home Phone: [phone] Relation: Spouse Secondary Emergency Contact: Rande LawmanFarmer,Pearl  [place] Home Phone: [phone] Relation: Sister  Code Status: FULL  Allergies: Amoxicillin  Chief Complaint  Patient presents with  . Discharge Note    HPI: Patient is 80 y.o. male who was admitted to SNF for generalized weakness from sepsis who is stable for transfer to another SNF closer to home.  Past Medical History  Diagnosis Date  . COPD (chronic obstructive pulmonary disease)   . Kidney disease   . Diabetes mellitus without complication   . Skin disease, bullous     History reviewed. No pertinent past surgical history.    Medication List       This list is accurate as of: 08/16/14  3:45 PM.  Always use your most recent med list.               acetaminophen 325 MG tablet  Commonly known as:  TYLENOL  Take 650 mg by mouth every 6 (six) hours as needed for mild pain.     albuterol (2.5 MG/3ML) 0.083% nebulizer solution  Commonly known as:  PROVENTIL  Take 3 mLs (2.5 mg total) by nebulization every 2 (two) hours as needed for wheezing or shortness of breath.     aspirin 325 MG tablet  Take 325 mg by mouth every morning.     folic acid 1 MG tablet  Commonly known as:  FOLVITE  Take 1 mg by mouth every morning.     furosemide 40 MG tablet  Commonly known as:  LASIX  Take 80 mg by mouth daily.     HYDROcodone-acetaminophen 7.5-325 MG per tablet  Commonly known as:  NORCO  Take 1 tablet by mouth every 6 (six) hours as needed for moderate pain.     insulin aspart 100 UNIT/ML injection  Commonly known as:  novoLOG   Inject 0-15 Units into the skin 3 (three) times daily with meals.     insulin glargine 100 UNIT/ML injection  Commonly known as:  LANTUS  Inject 0.24 mLs (24 Units total) into the skin at bedtime.     methotrexate 2.5 MG tablet  Commonly known as:  RHEUMATREX  Take 2.5 mg by mouth every Sunday.     metoprolol succinate 50 MG 24 hr tablet  Commonly known as:  TOPROL-XL  Take 50 mg by mouth every evening. Take with or immediately following a meal.     pravastatin 80 MG tablet  Commonly known as:  PRAVACHOL  Take 80 mg by mouth daily.     predniSONE 10 MG tablet  Commonly known as:  DELTASONE  Take 20 mg by mouth daily with breakfast.     predniSONE 5 MG tablet  Commonly known as:  DELTASONE  Take 1 tablet (5 mg total) by mouth daily with supper.     ranitidine 150 MG tablet  Commonly known as:  ZANTAC  Take 150 mg by mouth daily as needed for heartburn.     roflumilast 500 MCG Tabs tablet  Commonly known as:  DALIRESP  Take 500 mcg by mouth daily.     senna-docusate 8.6-50 MG per tablet  Commonly known as:  Senokot-S  Take 1 tablet by mouth 2 (two) times daily.     sodium bicarbonate 325 MG tablet  Take 325 mg by mouth 2 (two) times daily.     Vitamin D3 2000 UNITS Tabs  Take 2,000 Units by mouth daily.        No orders of the defined types were placed in this encounter.    Immunization History  Administered Date(s) Administered  . Influenza-Unspecified 05/17/2013    History  Substance Use Topics  . Smoking status: Current Every Day Smoker -- 0.50 packs/day for 59 years    Types: Cigarettes  . Smokeless tobacco: Not on file  . Alcohol Use: No    Filed Vitals:   08/16/14 1538  BP: 91/62  Pulse: 88  Temp: 97 F (36.1 C)  Resp: 20    Physical Exam  GENERAL APPEARANCE: Alert No acute distress.  HEENT: Unremarkable. RESPIRATORY: Breathing is even, unlabored. Lung sounds are clear   CARDIOVASCULAR: Heart RRR no murmurs, rubs or gallops. No  peripheral edema.  GASTROINTESTINAL: Abdomen is soft, non-tender, not distended w/ normal bowel sounds.  NEUROLOGIC: Cranial nerves 2-12 grossly intact. Moves all extremities  Patient Active Problem List   Diagnosis Date Noted  . Chronic kidney disease (CKD), stage IV (severe) 07/25/2014  . COPD (chronic obstructive pulmonary disease) 07/25/2014  . Sepsis 07/24/2014  . Acute on chronic kidney failure 07/24/2014  . Cellulitis of left foot 07/24/2014  . Cellulitis of right foot 07/24/2014  . Nausea and vomiting 07/24/2014  . DM type 2, uncontrolled, with renal complications 07/24/2014  . Hyperkalemia 07/24/2014  . Bullous pemphigoid 07/24/2014    CBC    Component Value Date/Time   WBC 8.5 07/30/2014 0645   RBC 3.34* 07/30/2014 0645   HGB 10.3* 07/30/2014 0645   HCT 32.9* 07/30/2014 0645   PLT 140* 07/30/2014 0645   MCV 98.5 07/30/2014 0645   LYMPHSABS 1.5 07/24/2014 1826   MONOABS 0.4 07/24/2014 1826   EOSABS 0.0 07/24/2014 1826   BASOSABS 0.0 07/24/2014 1826    CMP     Component Value Date/Time   NA 145 07/30/2014 0645   K 5.3 07/30/2014 0645   CL 107 07/30/2014 0645   CO2 22 07/30/2014 0645   GLUCOSE 100* 07/30/2014 0645   BUN 56* 07/30/2014 0645   CREATININE 2.42* 07/30/2014 0645   CALCIUM 9.4 07/30/2014 0645   PROT 6.5 07/24/2014 1826   ALBUMIN 3.1* 07/24/2014 1826   AST 11 07/24/2014 1826   ALT 21 07/24/2014 1826   ALKPHOS 70 07/24/2014 1826   BILITOT 0.4 07/24/2014 1826   GFRNONAA 23* 07/30/2014 0645   GFRAA 27* 07/30/2014 0645    Assessment and Plan  Pt is stable for transfer to another SNF closer to home.  Margit HanksALEXANDER, Odilon Cass D, MD

## 2014-12-16 DEATH — deceased

## 2016-05-18 IMAGING — CR DG CHEST 2V
2 series · 2 of 2 positions shown · non-contrast
Comparison: 04/26/2010

CLINICAL DATA: COPD, diabetes

EXAM:
CHEST  2 VIEW

[chest lat]
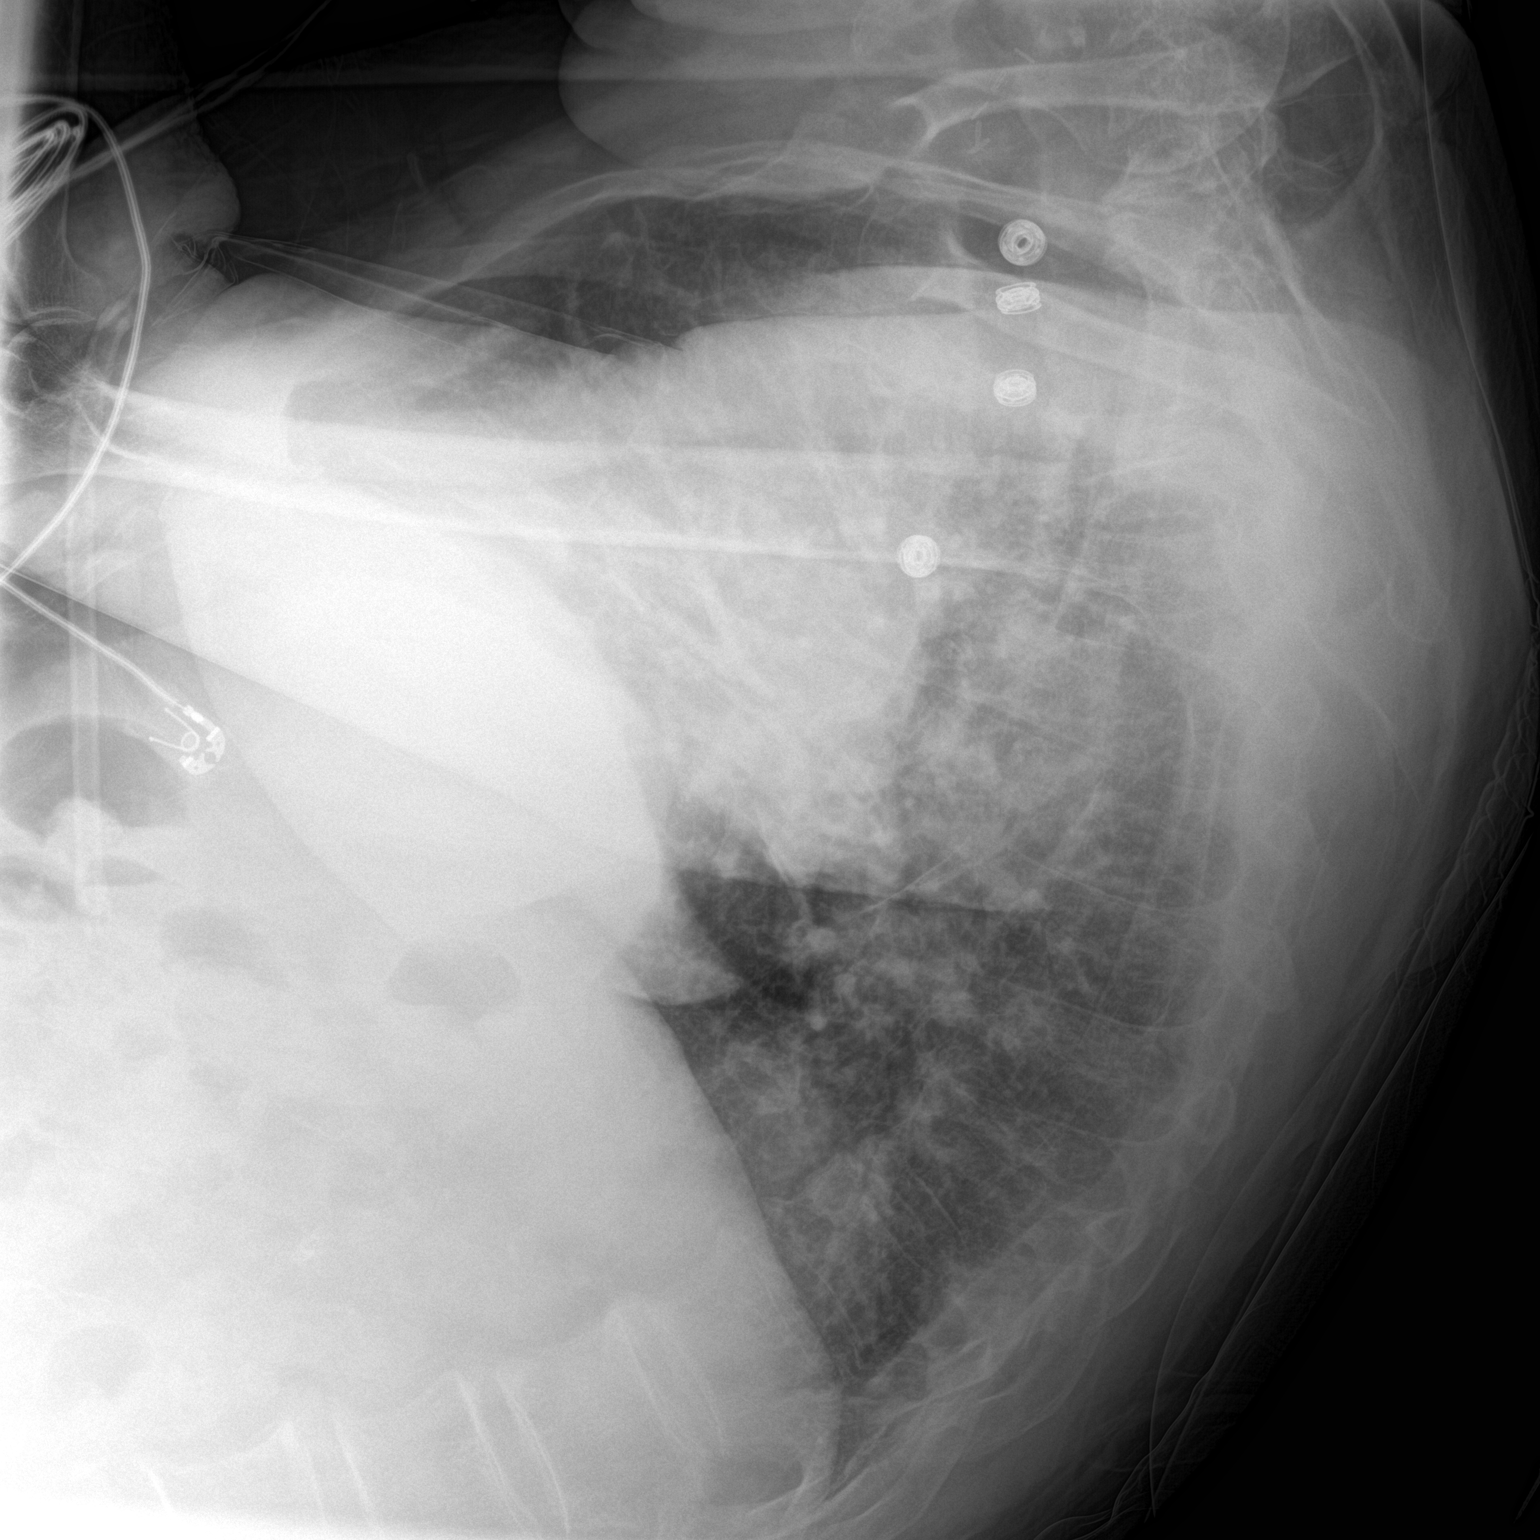

[chest ap]
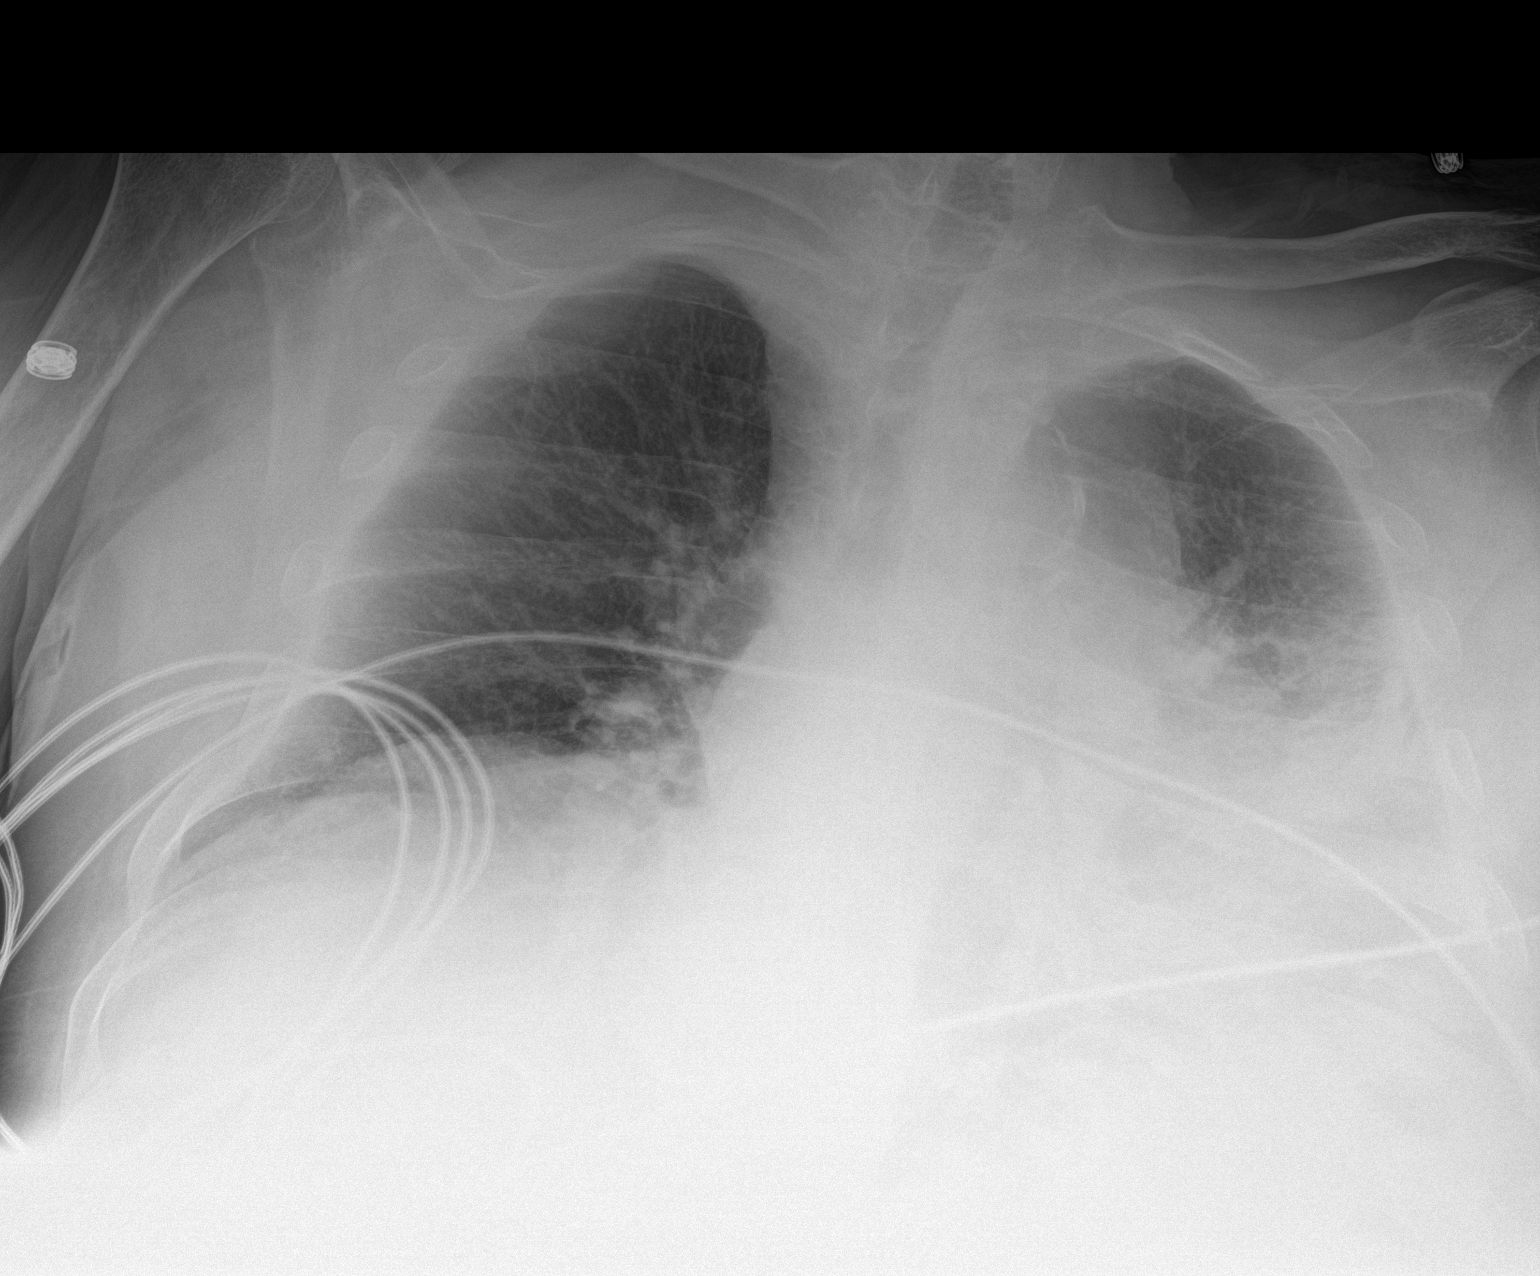

[2 of 2 positions shown; findings below may reference images not displayed]

FINDINGS: Persistent lingular scarring. Left lower lobe opacity which is
increased compared with CT chest dated 04/25/2010. There is no
pleural effusion or pneumothorax. Stable cardiomediastinal
silhouette. Unremarkable osseous structures.
IMPRESSION: Left lower lobe airspace opacity which is increased compared with
prior exams which may reflect scarring with concomitant atelectasis
or pneumonia.

## 2016-05-18 IMAGING — CR DG FOOT 2V*L*
2 series · 2 of 2 positions shown · non-contrast
Comparison: None.

CLINICAL DATA: Swelling and redness in bilateral feet. Open sores
of the posterior distal tibia.

EXAM:
LEFT FOOT - 2 VIEW

[foot ap]
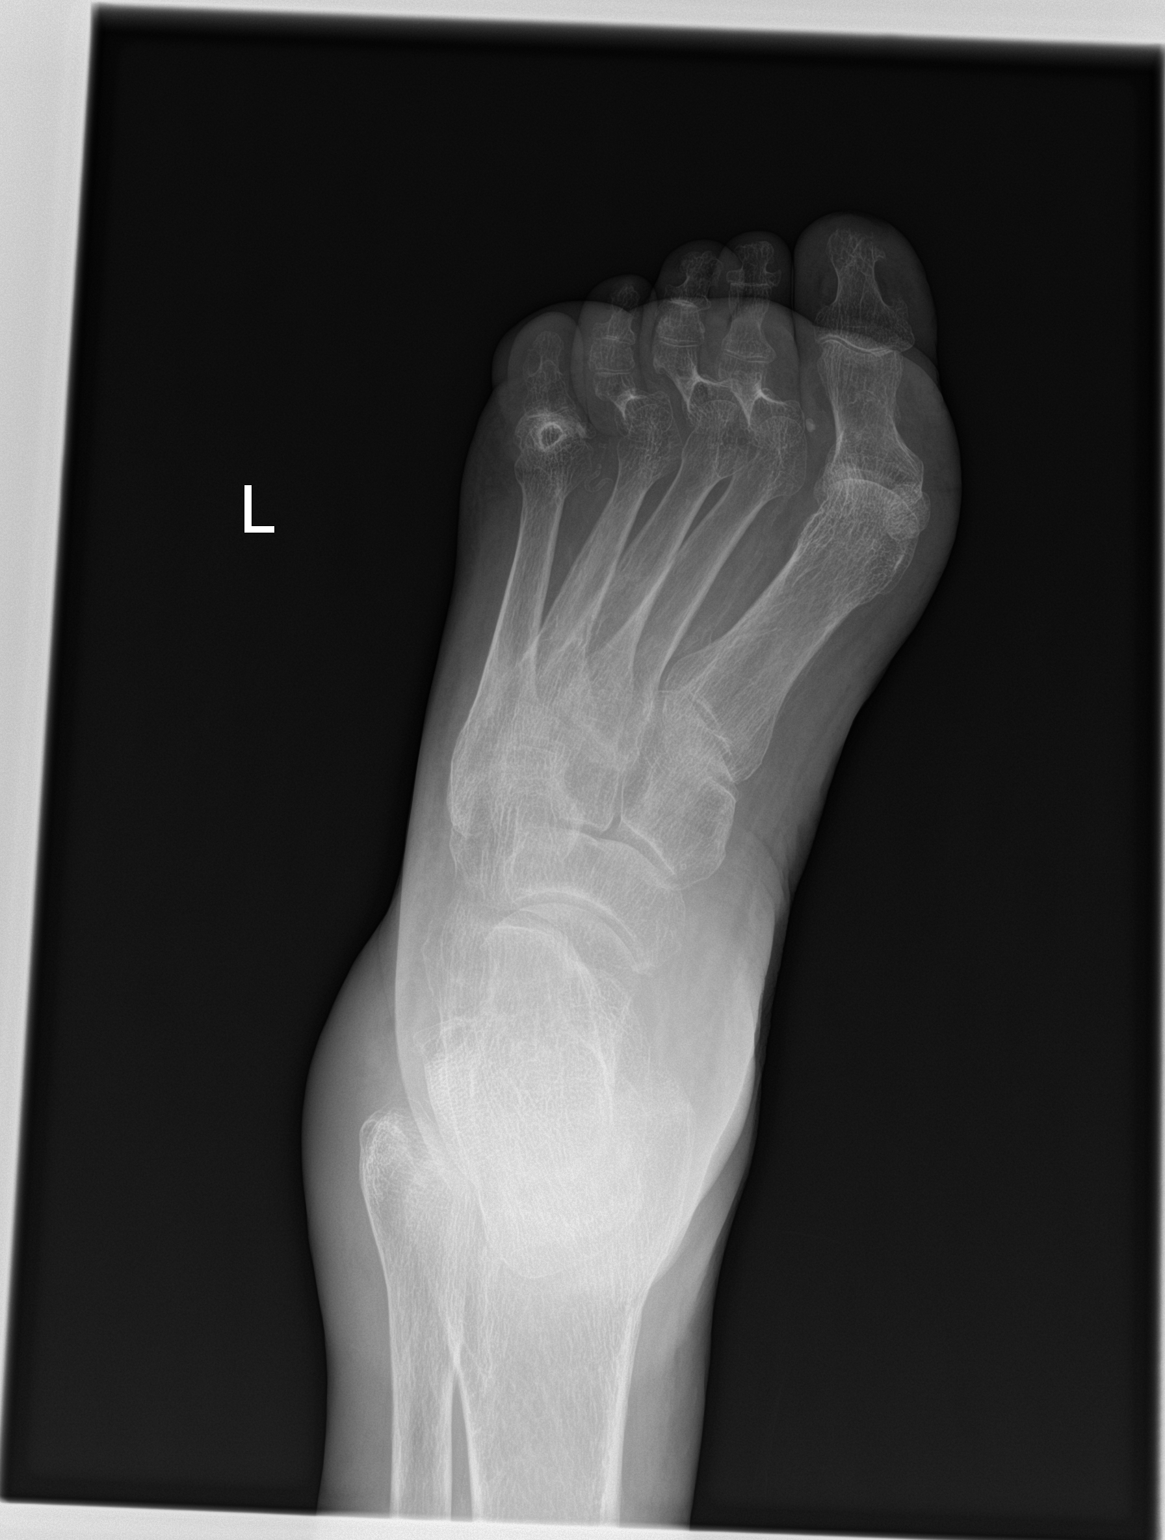

[foot lat]
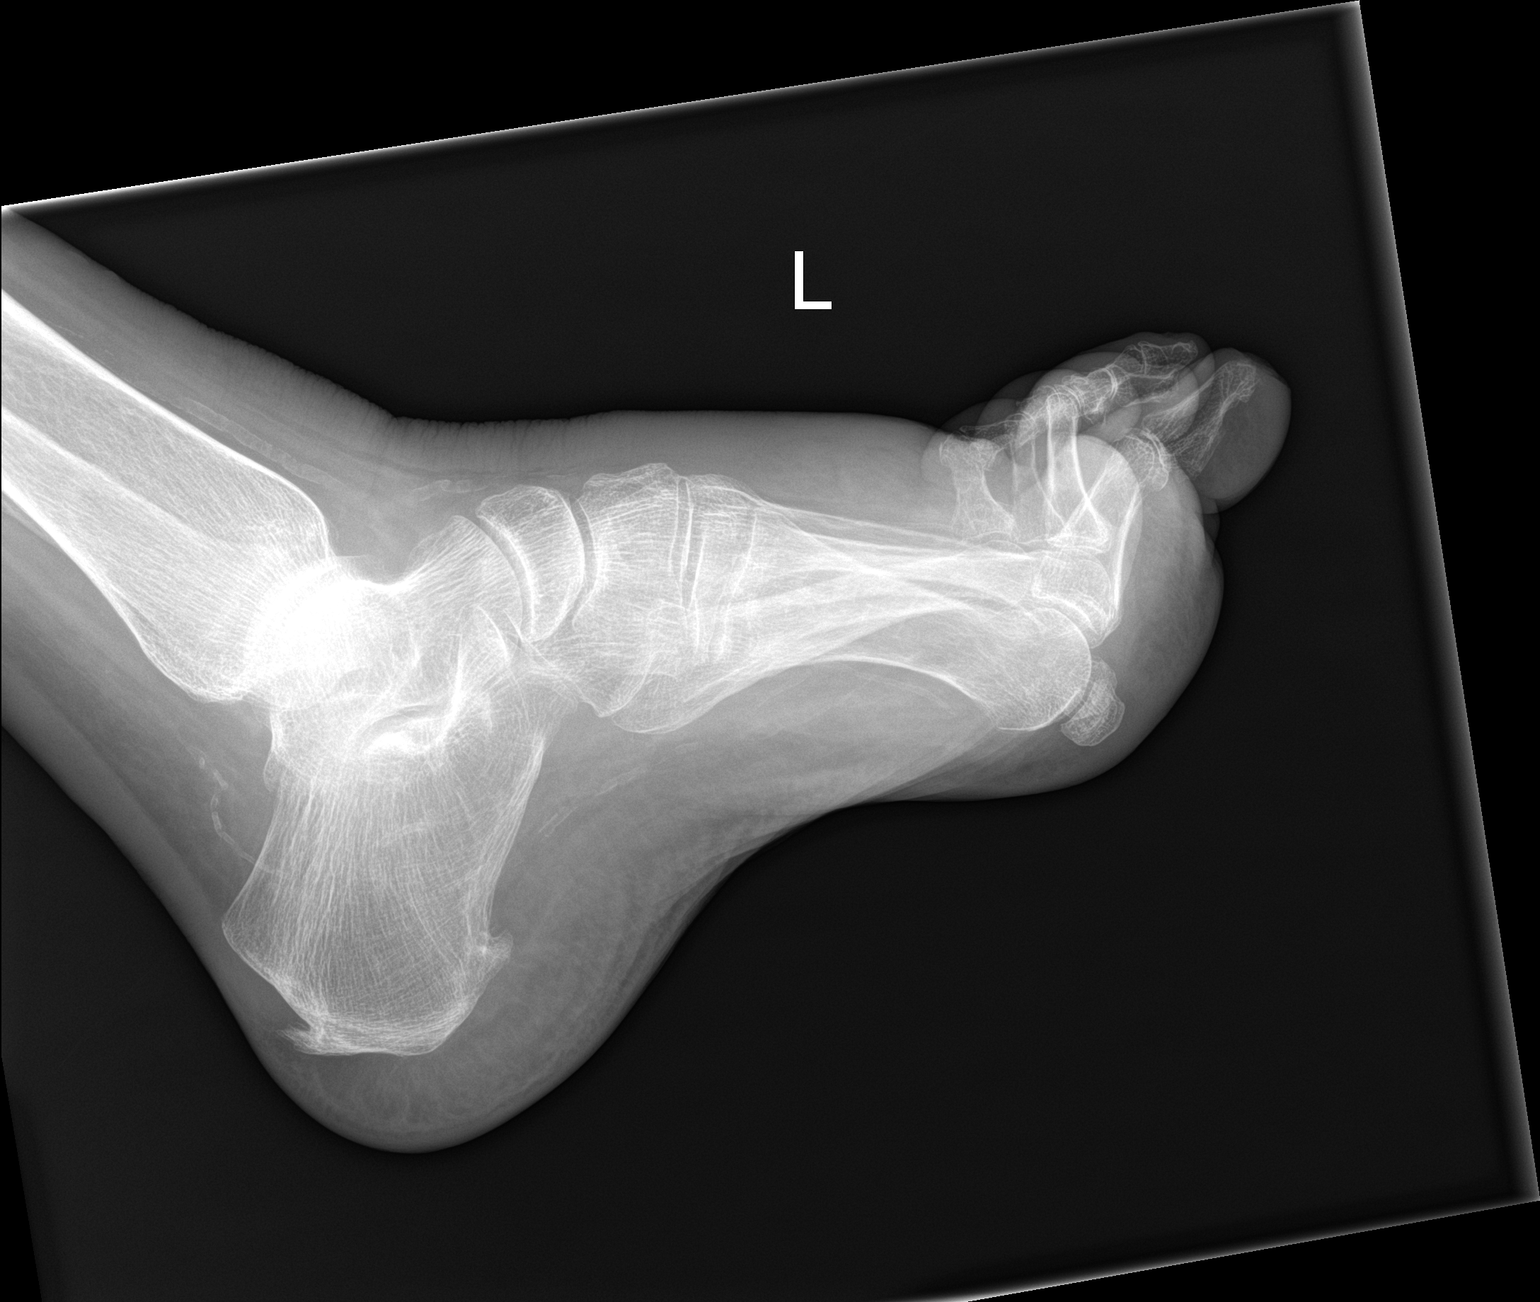

[2 of 2 positions shown; findings below may reference images not displayed]

FINDINGS: There is no evidence of fracture or dislocation. There is no
evidence of arthropathy or other focal bone abnormality. There is
soft tissue swelling over the dorsal midfoot. There is peripheral
vascular atherosclerotic disease.
IMPRESSION: 1. No radiographic evidence of osteomyelitis of the left foot. Soft
tissue swelling over the dorsal aspect of the left midfoot and
plantar aspect of the left forefoot which may reflect cellulitis.
